# Patient Record
Sex: Female | Born: 1965 | Marital: Single | Smoking: Former smoker
Health system: Southern US, Community
[De-identification: ages and names within clinical notes are randomized; demographics above are authoritative.]

---

## 2012-10-06 ENCOUNTER — Other Ambulatory Visit: Payer: Self-pay | Admitting: Neurology

## 2012-10-06 DIAGNOSIS — M5412 Radiculopathy, cervical region: Secondary | ICD-10-CM

## 2012-10-06 DIAGNOSIS — R51 Headache: Secondary | ICD-10-CM

## 2016-12-29 ENCOUNTER — Ambulatory Visit: Payer: Self-pay | Admitting: Physician Assistant

## 2017-01-22 ENCOUNTER — Encounter: Payer: Self-pay | Admitting: Physician Assistant

## 2017-01-22 ENCOUNTER — Ambulatory Visit (INDEPENDENT_AMBULATORY_CARE_PROVIDER_SITE_OTHER): Payer: BLUE CROSS/BLUE SHIELD | Admitting: Physician Assistant

## 2017-01-22 VITALS — BP 134/77 | HR 91 | Ht 64.0 in | Wt 262.0 lb

## 2017-01-22 DIAGNOSIS — Z0000 Encounter for general adult medical examination without abnormal findings: Secondary | ICD-10-CM | POA: Diagnosis not present

## 2017-01-22 DIAGNOSIS — Z1239 Encounter for other screening for malignant neoplasm of breast: Secondary | ICD-10-CM

## 2017-01-22 DIAGNOSIS — Z1231 Encounter for screening mammogram for malignant neoplasm of breast: Secondary | ICD-10-CM

## 2017-01-22 DIAGNOSIS — L4050 Arthropathic psoriasis, unspecified: Secondary | ICD-10-CM

## 2017-01-22 DIAGNOSIS — Z13220 Encounter for screening for lipoid disorders: Secondary | ICD-10-CM | POA: Diagnosis not present

## 2017-01-22 DIAGNOSIS — Z131 Encounter for screening for diabetes mellitus: Secondary | ICD-10-CM

## 2017-01-22 NOTE — Progress Notes (Signed)
   Subjective:    Patient ID: Sharon Tyler, female    DOB: [birth year], 51 y.o.   MRN: [id number]  HPI  Pt is a 51 yo female with psoratic arthritis who presents to the clinic to establish care.   .. Active Ambulatory Problems    Diagnosis Date Noted  . Psoriatic arthritis (HCC) 01/22/2017  . Morbid obesity (HCC) 01/25/2017   Resolved Ambulatory Problems    Diagnosis Date Noted  . No Resolved Ambulatory Problems   No Additional Past Medical History   .Marland Kitchen. Family History  Problem Relation Age of Onset  . Cancer Mother     breast  . Cancer Father   . Hypertension Brother   . Cancer Maternal Aunt     colon   .Marland Kitchen. Social History   Social History  . Marital status: Single    Spouse name: N/A  . Number of children: N/A  . Years of education: N/A   Occupational History  . Not on file.   Social History Main Topics  . Smoking status: Former Games developermoker  . Smokeless tobacco: Never Used  . Alcohol use No  . Drug use: No  . Sexual activity: No   Other Topics Concern  . Not on file   Social History Narrative  . No narrative on file   DX of psoratic arthritis in 98 but start methotrexate 2 years ago. Taking 4 tablets weekly. Managed by rheumatology every 3 months.     Review of Systems  All other systems reviewed and are negative.      Objective:   Physical Exam  Constitutional: She is oriented to person, place, and time. She appears well-developed and well-nourished.  Morbidly obese.   HENT:  Head: Normocephalic and atraumatic.  Neck: Normal range of motion. Neck supple. No thyromegaly present.  Cardiovascular: Normal rate, regular rhythm and normal heart sounds.   Pulmonary/Chest: Effort normal and breath sounds normal.  Lymphadenopathy:    She has no cervical adenopathy.  Neurological: She is alert and oriented to person, place, and time.  Psychiatric: She has a normal mood and affect. Her behavior is normal.          Assessment & Plan:  Marland Kitchen.Marland Kitchen.Diagnoses and all  orders for this visit:  Routine physical examination -     Lipid panel -     COMPLETE METABOLIC PANEL WITH GFR -     TSH -     B12 -     Vitamin D 1,25 dihydroxy  Psoriatic arthritis (HCC)  Screening for lipid disorders -     Lipid panel  Screening for diabetes mellitus -     COMPLETE METABOLIC PANEL WITH GFR  Morbid obesity (HCC) -     TSH  Breast cancer screening -     MM DIGITAL SCREENING BILATERAL; Future -     MM DIGITAL SCREENING BILATERAL   Discussed cologuard. Pt agreed.  Discussed weight. Encouraged 150 minutes of cardio weekly. 1500 calorie diet. Come back to discussed medications for weight loss.

## 2017-01-22 NOTE — Patient Instructions (Signed)

## 2017-01-25 ENCOUNTER — Telehealth: Payer: Self-pay | Admitting: Physician Assistant

## 2017-01-25 NOTE — Telephone Encounter (Signed)
Pt did not fill out info for cologuard. Can we get her to come by and sign so kit can be sen in mail.

## 2017-01-26 NOTE — Telephone Encounter (Signed)
Left vmm for patient to come to office and sign form for Cologuard so kit can be sent to her.

## 2017-05-17 ENCOUNTER — Encounter: Payer: Self-pay | Admitting: Physician Assistant

## 2017-05-17 ENCOUNTER — Ambulatory Visit (INDEPENDENT_AMBULATORY_CARE_PROVIDER_SITE_OTHER): Payer: BLUE CROSS/BLUE SHIELD | Admitting: Physician Assistant

## 2017-05-17 VITALS — BP 135/82 | HR 111 | Ht 64.0 in | Wt 258.0 lb

## 2017-05-17 DIAGNOSIS — N3001 Acute cystitis with hematuria: Secondary | ICD-10-CM | POA: Diagnosis not present

## 2017-05-17 DIAGNOSIS — R195 Other fecal abnormalities: Secondary | ICD-10-CM | POA: Diagnosis not present

## 2017-05-17 DIAGNOSIS — R350 Frequency of micturition: Secondary | ICD-10-CM | POA: Diagnosis not present

## 2017-05-17 DIAGNOSIS — R109 Unspecified abdominal pain: Secondary | ICD-10-CM

## 2017-05-17 LAB — POCT URINALYSIS DIPSTICK
Bilirubin, UA: NEGATIVE
Glucose, UA: NEGATIVE
KETONES UA: NEGATIVE
Nitrite, UA: NEGATIVE
PROTEIN UA: NEGATIVE
SPEC GRAV UA: 1.01 (ref 1.010–1.025)
UROBILINOGEN UA: 0.2 U/dL
pH, UA: 6 (ref 5.0–8.0)

## 2017-05-17 MED ORDER — NITROFURANTOIN MONOHYD MACRO 100 MG PO CAPS
100.0000 mg | ORAL_CAPSULE | Freq: Two times a day (BID) | ORAL | 0 refills | Status: DC
Start: 2017-05-17 — End: 2019-01-04

## 2017-05-17 NOTE — Patient Instructions (Signed)

## 2017-05-17 NOTE — Progress Notes (Signed)
   Subjective:    Patient ID: Sharon Tyler, female    DOB: 1966-01-28, 51 y.o.   MRN: [id number]  HPI  Pt is a 51 yo morbidly obese female with psoratic arthritis who is on methotrexate who has left flank pain, urinary frequency/urgency for 5 days. No fever, chills. She has daily body aches, pain, myalgias. She stopped this weeks metotrexate and started taking AZO. She is drinking cranberry juice.   She is concerned with 2 green solid poops she had yesterday. She has not started any new daily medications or supplements. She has been on azo for 3 days and had some blue cake icing.   .. Active Ambulatory Problems    Diagnosis Date Noted  . Psoriatic arthritis (HCC) 01/22/2017  . Morbid obesity (HCC) 01/25/2017   Resolved Ambulatory Problems    Diagnosis Date Noted  . No Resolved Ambulatory Problems   No Additional Past Medical History         Review of Systems  All other systems reviewed and are negative.      Objective:   Physical Exam  Constitutional: She is oriented to person, place, and time. She appears well-developed and well-nourished.  Morbidly obese.   HENT:  Head: Normocephalic and atraumatic.  Cardiovascular: Normal rate, regular rhythm and normal heart sounds.   Pulmonary/Chest: Effort normal and breath sounds normal.  Mild tenderness left flank to palpation.   Abdominal:  Diffusely tender in bilateral lower abdominal quadrants.   Neurological: She is alert and oriented to person, place, and time.  Psychiatric: She has a normal mood and affect. Her behavior is normal.          Assessment & Plan:  Marland Kitchen.Marland Kitchen.Sharon Tyler was seen today for abdominal pain, left flank pain and urinary frequency.  Diagnoses and all orders for this visit:  Acute cystitis with hematuria -     nitrofurantoin, macrocrystal-monohydrate, (MACROBID) 100 MG capsule; Take 1 capsule (100 mg total) by mouth 2 (two) times daily. For 7 days. -     Urine Culture  Urinary frequency -     POCT  urinalysis dipstick -     Urine Culture  Left flank pain -     POCT urinalysis dipstick -     Urine Culture  Change in stool   .Marland Kitchen. Results for orders placed or performed in visit on 05/17/17  POCT urinalysis dipstick  Result Value Ref Range   Color, UA yellow    Clarity, UA cloudy    Glucose, UA neg    Bilirubin, UA neg    Ketones, UA neg    Spec Grav, UA 1.010 1.010 - 1.025   Blood, UA small    pH, UA 6.0 5.0 - 8.0   Protein, UA neg    Urobilinogen, UA 0.2 0.2 or 1.0 E.U./dL   Nitrite, UA neg    Leukocytes, UA Moderate (2+) (A) Negative   Will treat with abx.  Will culture.  Discussed symptomatic care.  If pain not improving may need to consider imaging to rule out kidney stone. Increase hydration.   May resume metotrexate this week. Follow up if symptoms not improving.   Unclear if anything pathological with 2 green poop. I feel like could be something she has eaten. She did have blue icing. Could also be AZO. Will continue to monitor.

## 2017-05-18 ENCOUNTER — Encounter: Payer: Self-pay | Admitting: Physician Assistant

## 2017-05-20 LAB — URINE CULTURE

## 2019-01-04 ENCOUNTER — Ambulatory Visit (INDEPENDENT_AMBULATORY_CARE_PROVIDER_SITE_OTHER): Payer: BLUE CROSS/BLUE SHIELD | Admitting: Physician Assistant

## 2019-01-04 ENCOUNTER — Encounter: Payer: Self-pay | Admitting: Physician Assistant

## 2019-01-04 VITALS — BP 118/78 | HR 93 | Temp 98.5°F | Ht 64.0 in | Wt 251.0 lb

## 2019-01-04 DIAGNOSIS — J40 Bronchitis, not specified as acute or chronic: Secondary | ICD-10-CM | POA: Diagnosis not present

## 2019-01-04 DIAGNOSIS — J329 Chronic sinusitis, unspecified: Secondary | ICD-10-CM

## 2019-01-04 MED ORDER — AZITHROMYCIN 250 MG PO TABS: ORAL_TABLET | ORAL | 0 refills | Status: DC

## 2019-01-04 MED ORDER — PREDNISONE 50 MG PO TABS: ORAL_TABLET | ORAL | 0 refills | Status: DC

## 2019-01-04 NOTE — Progress Notes (Signed)
   Subjective:    Patient ID: Sharon Tyler, female    DOB: August 20, 1966, 53 y.o.   MRN: [id number]  HPI  Pt is a 53 yo female with psoriatic arthritis and former smoker who presents to the clinic cough, sinus pressure, ear pressure, productive mucus for the last 2 weeks.  At one point she thought she is getting a little bit better and then took a turn for the worse.  She has been taking her methotrexate regularly.  She does admit today she felt a little bit dizzy.  She has not had anything to drink but coffee.  She has not really taken anything over-the-counter because of being on methotrexate. She denies any SOB, wheezing, body aches, chills.   .. Active Ambulatory Problems    Diagnosis Date Noted  . Psoriatic arthritis (HCC) 01/22/2017  . Morbid obesity (HCC) 01/25/2017   Resolved Ambulatory Problems    Diagnosis Date Noted  . No Resolved Ambulatory Problems   No Additional Past Medical History     Review of Systems    see HPI.  Objective:   Physical Exam Vitals signs reviewed.  Constitutional:      Appearance: Normal appearance. She is obese.  HENT:     Head: Normocephalic and atraumatic.     Right Ear: Tympanic membrane, ear canal and external ear normal.     Left Ear: Tympanic membrane, ear canal and external ear normal.     Nose: Congestion present.     Mouth/Throat:     Mouth: Mucous membranes are moist.     Pharynx: Posterior oropharyngeal erythema present. No oropharyngeal exudate.  Eyes:     Conjunctiva/sclera: Conjunctivae normal.  Cardiovascular:     Rate and Rhythm: Normal rate and regular rhythm.  Pulmonary:     Effort: Pulmonary effort is normal.     Breath sounds: Wheezing and rhonchi present.  Lymphadenopathy:     Cervical: Cervical adenopathy present.  Neurological:     General: No focal deficit present.     Mental Status: She is alert and oriented to person, place, and time.  Psychiatric:        Mood and Affect: Mood normal.        Behavior: Behavior  normal.           Assessment & Plan:  Marland KitchenMarland KitchenSadiqa was seen today for fever.  Diagnoses and all orders for this visit:  Sinobronchitis -     azithromycin (ZITHROMAX) 250 MG tablet; Take 2 tablets now and then one tablet for 4 days. -     predniSONE (DELTASONE) 50 MG tablet; Take one tablet for 5 days. -     DG Chest 2 View   Treated due to immunosuppressed state and duration with zpak and prednisone. Stay hydrated. BP was lower than normal today and could cause dizziness. Hold methotrexate until finish abx. Call rheumatology about increase if thinks was sick at the onset of labs.  Mentioned mammogram and colonoscopy. Pt declined.   Pt needs CPE in near future.

## 2019-06-30 ENCOUNTER — Ambulatory Visit (INDEPENDENT_AMBULATORY_CARE_PROVIDER_SITE_OTHER): Payer: BC Managed Care – PPO | Admitting: Physician Assistant

## 2019-06-30 ENCOUNTER — Ambulatory Visit: Payer: BLUE CROSS/BLUE SHIELD | Admitting: Physician Assistant

## 2019-06-30 ENCOUNTER — Other Ambulatory Visit: Payer: Self-pay

## 2019-06-30 ENCOUNTER — Encounter: Payer: Self-pay | Admitting: Physician Assistant

## 2019-06-30 VITALS — BP 132/67 | HR 91 | Temp 99.0°F | Ht 64.25 in | Wt 258.0 lb

## 2019-06-30 DIAGNOSIS — I8390 Asymptomatic varicose veins of unspecified lower extremity: Secondary | ICD-10-CM

## 2019-06-30 DIAGNOSIS — L4050 Arthropathic psoriasis, unspecified: Secondary | ICD-10-CM

## 2019-06-30 DIAGNOSIS — E569 Vitamin deficiency, unspecified: Secondary | ICD-10-CM | POA: Diagnosis not present

## 2019-06-30 DIAGNOSIS — Z0000 Encounter for general adult medical examination without abnormal findings: Secondary | ICD-10-CM

## 2019-06-30 DIAGNOSIS — Z13220 Encounter for screening for lipoid disorders: Secondary | ICD-10-CM

## 2019-06-30 DIAGNOSIS — Z1211 Encounter for screening for malignant neoplasm of colon: Secondary | ICD-10-CM

## 2019-06-30 DIAGNOSIS — E538 Deficiency of other specified B group vitamins: Secondary | ICD-10-CM

## 2019-06-30 NOTE — Progress Notes (Signed)
Subjective:     Sharon Tyler is a 53 y.o. female and is here for a comprehensive physical exam. The patient reports problems - see below. .  She is having a lot of trouble wearing mask all the time. She works at [employer]. She is not around people. She is willing to wear face shield and mask when cannot social distance.   She is desperate to lose weight. She wants to discuss plan.   Rheumatology gave her Enbrel because CRP was way up. She refuses to start due to cost and what's in it.     Social History   Socioeconomic History  . Marital status: Single    Spouse name: Not on file  . Number of children: Not on file  . Years of education: Not on file  . Highest education level: Not on file  Occupational History  . Not on file  Social Needs  . Financial resource strain: Not on file  . Food insecurity    Worry: Not on file    Inability: Not on file  . Transportation needs    Medical: Not on file    Non-medical: Not on file  Tobacco Use  . Smoking status: Former Research scientist (life sciences)  . Smokeless tobacco: Never Used  Substance and Sexual Activity  . Alcohol use: No  . Drug use: No  . Sexual activity: Never  Lifestyle  . Physical activity    Days per week: Not on file    Minutes per session: Not on file  . Stress: Not on file  Relationships  . Social Herbalist on phone: Not on file    Gets together: Not on file    Attends religious service: Not on file    Active member of club or organization: Not on file    Attends meetings of clubs or organizations: Not on file    Relationship status: Not on file  . Intimate partner violence    Fear of current or ex partner: Not on file    Emotionally abused: Not on file    Physically abused: Not on file    Forced sexual activity: Not on file  Other Topics Concern  . Not on file  Social History Narrative  . Not on file   Health Maintenance  Topic Date Due  . MAMMOGRAM  01/07/2020 (Originally 04/23/2014)  .  COLONOSCOPY  01/07/2020 (Originally 04/23/2014)  . Hepatitis C Screening  01/25/2027 (Originally 04/03/1966)  . HIV Screening  01/25/2027 (Originally 04/24/1979)  . INFLUENZA VACCINE  07/15/2019  . PAP SMEAR-Modifier  Discontinued  . TETANUS/TDAP  Discontinued    The following portions of the patient's history were reviewed and updated as appropriate: allergies, current medications, past family history, past medical history, past social history, past surgical history and problem list.  Review of Systems Pertinent items noted in HPI and remainder of comprehensive ROS otherwise negative.   Objective:    BP 132/67   Pulse 91   Temp 99 F (37.2 C) (Oral)   Ht 5' 4.25" (1.632 m)   Wt 258 lb (117 kg)   SpO2 96%   BMI 43.94 kg/m  General appearance: alert, cooperative, appears stated age and morbidly obese Head: Normocephalic, without obvious abnormality, atraumatic Eyes: conjunctivae/corneas clear. PERRL, EOM's intact. Fundi benign. Ears: normal TM's and external ear canals both ears Nose: Nares normal. Septum midline. Mucosa normal. No drainage or sinus tenderness. Throat: lips, mucosa, and tongue normal; teeth and gums normal Neck:  no adenopathy, no carotid bruit, no JVD, supple, symmetrical, trachea midline and thyroid not enlarged, symmetric, no tenderness/mass/nodules Back: symmetric, no curvature. ROM normal. No CVA tenderness. Lungs: clear to auscultation bilaterally Heart: regular rate and rhythm, S1, S2 normal, no murmur, click, rub or gallop Abdomen: soft, non-tender; bowel sounds normal; no masses,  no organomegaly Extremities: extremities normal, atraumatic, no cyanosis or edema and herberdens nodules noted.  Pulses: 2+ and symmetric Skin: Skin color, texture, turgor normal. No rashes or lesions Lymph nodes: Cervical, supraclavicular, and axillary nodes normal. Neurologic: Alert and oriented X 3, normal strength and tone. Normal symmetric reflexes. Normal coordination and  gait    Assessment:    Healthy female exam.      Plan:    Marland Kitchen.Marland Kitchen.Tresa EndoKelly was seen today for annual exam.  Diagnoses and all orders for this visit:  Routine physical examination -     VITAMIN D 25 Hydroxy (Vit-D Deficiency, Fractures) -     Cancel: B12 and Folate Panel -     Lipid Panel w/reflex Direct LDL -     Cancel: TSH -     COMPLETE METABOLIC PANEL WITH GFR  Psoriatic arthritis (HCC)  Varicose veins of lower extremity, unspecified laterality, unspecified whether complicated  Vitamin deficiency -     VITAMIN D 25 Hydroxy (Vit-D Deficiency, Fractures)  B12 deficiency -     Cancel: B12 and Folate Panel  Screening for lipid disorders -     Lipid Panel w/reflex Direct LDL  Morbid obesity (HCC) -     Cancel: TSH   .Marland Kitchen. [facility] screen [facility]/30/2019  Decreased Interest 0  Down, Depressed, Hopeless 0  PHQ - 2 Score 0  Altered sleeping 2  Tired, decreased energy 2  Change in appetite 0  Feeling bad or failure about yourself  0  Trouble concentrating 0  Moving slowly or fidgety/restless 0  Suicidal thoughts 0  PHQ-9 Score 4  Difficult doing work/chores Somewhat difficult   .Marland Kitchen. Discussed 150 minutes of exercise a week.  Encouraged vitamin D 1000 units and Calcium 1300mg  or 4 servings of dairy a day.  Fasting labs ordered.  Declined pap.  Declined mammogram/colonoscopy. Pt aware of risk.  She agreed to do cologuard.  Declined shingrix.   Will write mask for intermittent use.   Marland Kitchen..Discussed low carb diet with 1500 calories and 80g of protein.  Exercising at least 150 minutes a week.  My Fitness Pal could be a [employer] resource.  Discussed IF and benefits.  Discussed saxenda/contrave/qsymia. If she would like to try see what insurance would pay for.    See After Visit Summary for Counseling Recommendations

## 2019-06-30 NOTE — Patient Instructions (Signed)
Health Maintenance, Female Adopting a healthy lifestyle and getting preventive care are important in promoting health and wellness. Ask your health care provider about:  The right schedule for you to have regular tests and exams.  Things you can do on your own to prevent diseases and keep yourself healthy. What should I know about diet, weight, and exercise? Eat a healthy diet   Eat a diet that includes plenty of vegetables, fruits, low-fat dairy products, and lean protein.  Do not eat a lot of foods that are high in solid fats, added sugars, or sodium. Maintain a healthy weight Body mass index (BMI) is used to identify weight problems. It estimates body fat based on height and weight. Your health care provider can help determine your BMI and help you achieve or maintain a healthy weight. Get regular exercise Get regular exercise. This is one of the most important things you can do for your health. Most adults should:  Exercise for at least 150 minutes each week. The exercise should increase your heart rate and make you sweat (moderate-intensity exercise).  Do strengthening exercises at least twice a week. This is in addition to the moderate-intensity exercise.  Spend less time sitting. Even light physical activity can be beneficial. Watch cholesterol and blood lipids Have your blood tested for lipids and cholesterol at 55 years of age, then have this test every 5 years. Have your cholesterol levels checked more often if:  Your lipid or cholesterol levels are high.  You are older than 55 years of age.  You are at high risk for heart disease. What should I know about cancer screening? Depending on your health history and family history, you may need to have cancer screening at various ages. This may include screening for:  Breast cancer.  Cervical cancer.  Colorectal cancer.  Skin cancer.  Lung cancer. What should I know about heart disease, diabetes, and high blood  pressure? Blood pressure and heart disease  High blood pressure causes heart disease and increases the risk of stroke. This is more likely to develop in people who have high blood pressure readings, are of African descent, or are overweight.  Have your blood pressure checked: ? Every 3-5 years if you are 18-39 years of age. ? Every year if you are 40 years old or older. Diabetes Have regular diabetes screenings. This checks your fasting blood sugar level. Have the screening done:  Once every three years after age 40 if you are at a normal weight and have a low risk for diabetes.  More often and at a younger age if you are overweight or have a high risk for diabetes. What should I know about preventing infection? Hepatitis B If you have a higher risk for hepatitis B, you should be screened for this virus. Talk with your health care provider to find out if you are at risk for hepatitis B infection. Hepatitis C Testing is recommended for:  Everyone born from 1945 through 1965.  Anyone with known risk factors for hepatitis C. Sexually transmitted infections (STIs)  Get screened for STIs, including gonorrhea and chlamydia, if: ? You are sexually active and are younger than 55 years of age. ? You are older than 55 years of age and your health care provider tells you that you are at risk for this type of infection. ? Your sexual activity has changed since you were last screened, and you are at increased risk for chlamydia or gonorrhea. Ask your health care provider if   you are at risk.  Ask your health care provider about whether you are at high risk for HIV. Your health care provider may recommend a prescription medicine to help prevent HIV infection. If you choose to take medicine to prevent HIV, you should first get tested for HIV. You should then be tested every 3 months for as long as you are taking the medicine. Pregnancy  If you are about to stop having your period (premenopausal) and  you may become pregnant, seek counseling before you get pregnant.  Take 400 to 800 micrograms (mcg) of folic acid every day if you become pregnant.  Ask for birth control (contraception) if you want to prevent pregnancy. Osteoporosis and menopause Osteoporosis is a disease in which the bones lose minerals and strength with aging. This can result in bone fractures. If you are 65 years old or older, or if you are at risk for osteoporosis and fractures, ask your health care provider if you should:  Be screened for bone loss.  Take a calcium or vitamin D supplement to lower your risk of fractures.  Be given hormone replacement therapy (HRT) to treat symptoms of menopause. Follow these instructions at home: Lifestyle  Do not use any products that contain nicotine or tobacco, such as cigarettes, e-cigarettes, and chewing tobacco. If you need help quitting, ask your health care provider.  Do not use street drugs.  Do not share needles.  Ask your health care provider for help if you need support or information about quitting drugs. Alcohol use  Do not drink alcohol if: ? Your health care provider tells you not to drink. ? You are pregnant, may be pregnant, or are planning to become pregnant.  If you drink alcohol: ? Limit how much you use to 0-1 drink a day. ? Limit intake if you are breastfeeding.  Be aware of how much alcohol is in your drink. In the [place] one drink equals one 12 oz bottle of beer (355 mL), one 5 oz glass of wine (148 mL), or one 1 oz glass of hard liquor (44 mL). General instructions  Schedule regular health, dental, and eye exams.  Stay current with your vaccines.  Tell your health care provider if: ? You often feel depressed. ? You have ever been abused or do not feel safe at home. Summary  Adopting a healthy lifestyle and getting preventive care are important in promoting health and wellness.  Follow your health care provider's instructions about healthy  diet, exercising, and getting tested or screened for diseases.  Follow your health care provider's instructions on monitoring your cholesterol and blood pressure. This information is not intended to replace advice given to you by your health care provider. Make sure you discuss any questions you have with your health care provider. Document Released: 06/15/2011 Document Revised: 11/23/2018 Document Reviewed: 11/23/2018 Elsevier Patient Education  2020 [employer]  

## 2019-07-03 DIAGNOSIS — E559 Vitamin D deficiency, unspecified: Secondary | ICD-10-CM | POA: Insufficient documentation

## 2019-07-03 DIAGNOSIS — E569 Vitamin deficiency, unspecified: Secondary | ICD-10-CM | POA: Insufficient documentation

## 2019-07-03 DIAGNOSIS — E538 Deficiency of other specified B group vitamins: Secondary | ICD-10-CM | POA: Insufficient documentation

## 2019-11-20 ENCOUNTER — Ambulatory Visit (INDEPENDENT_AMBULATORY_CARE_PROVIDER_SITE_OTHER): Payer: BC Managed Care – PPO

## 2019-11-20 ENCOUNTER — Encounter: Payer: Self-pay | Admitting: Physician Assistant

## 2019-11-20 ENCOUNTER — Ambulatory Visit (INDEPENDENT_AMBULATORY_CARE_PROVIDER_SITE_OTHER): Payer: BC Managed Care – PPO | Admitting: Physician Assistant

## 2019-11-20 ENCOUNTER — Other Ambulatory Visit: Payer: Self-pay

## 2019-11-20 VITALS — BP 147/74 | HR 83 | Ht 64.0 in | Wt 265.0 lb

## 2019-11-20 DIAGNOSIS — M5441 Lumbago with sciatica, right side: Secondary | ICD-10-CM | POA: Diagnosis not present

## 2019-11-20 DIAGNOSIS — M4807 Spinal stenosis, lumbosacral region: Secondary | ICD-10-CM | POA: Diagnosis not present

## 2019-11-20 DIAGNOSIS — M5136 Other intervertebral disc degeneration, lumbar region: Secondary | ICD-10-CM

## 2019-11-20 DIAGNOSIS — M62838 Other muscle spasm: Secondary | ICD-10-CM

## 2019-11-20 DIAGNOSIS — G8929 Other chronic pain: Secondary | ICD-10-CM | POA: Insufficient documentation

## 2019-11-20 DIAGNOSIS — M47816 Spondylosis without myelopathy or radiculopathy, lumbar region: Secondary | ICD-10-CM | POA: Diagnosis not present

## 2019-11-20 DIAGNOSIS — R29898 Other symptoms and signs involving the musculoskeletal system: Secondary | ICD-10-CM

## 2019-11-20 MED ORDER — PREDNISONE 50 MG PO TABS: ORAL_TABLET | ORAL | 0 refills | Status: DC

## 2019-11-20 MED ORDER — KETOROLAC TROMETHAMINE 60 MG/2ML IM SOLN
60.0000 mg | Freq: Once | INTRAMUSCULAR | Status: AC
  Administered 2019-11-20: 60 mg via INTRAMUSCULAR

## 2019-11-20 MED ORDER — CYCLOBENZAPRINE HCL 10 MG PO TABS: 10.0000 mg | ORAL_TABLET | Freq: Three times a day (TID) | ORAL | 0 refills | Status: DC | PRN

## 2019-11-20 MED ORDER — IBUPROFEN 800 MG PO TABS: 800.0000 mg | ORAL_TABLET | Freq: Three times a day (TID) | ORAL | 0 refills | Status: AC | PRN

## 2019-11-20 NOTE — Progress Notes (Signed)
Subjective:     Patient ID: Sharon Tyler, female   DOB: 11/04/1966, 53 y.o.   MRN: [id number]  HPI Pt is a 53 yo female with a hx psoriatic arthritis and  Pt reported spinal stenosis who presents to the clinic with complaints of tight back pain.   She reports the pain beginning in May and progressively worsening since then. Pt describes the pain as severe and sharp. Pain is worse on the right side.    She admits to numbness and tingling in bilateral lower extremities. Positive for multiple episodes of urinary incontinence during bouts of extreme pain. Denies bowel incontinence or saddle anesthesia. She does feel like her right leg is getting weaker. At times she feels like "it just will not lift".    Denies recent injury. 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 Pt states working makes the pain worse.  Ibuprofen, stretching exercises, heat/ice provide minimal relief. Tried TENS machine, with some relief.    She states it is difficult for her to move at times.  She reports that when sttanding on feet for long periods of time she starts to get shaky. These episodes are only relieved by rest. She does get some relief when leaning forward.   Last xray was done in 2013.i cannot see image.   .. Active Ambulatory Problems    Diagnosis Date Noted  . Psoriatic arthritis ([place]) 01/22/2017  . Morbid obesity ([place]) 01/25/2017  . Varicose veins of lower extremity 06/30/2019  . B12 deficiency 07/03/2019  . Vitamin deficiency 07/03/2019  . Chronic right-sided low back pain with right-sided sciatica 11/20/2019  . Spinal stenosis of lumbosacral region 11/20/2019  . Facet arthritis of lumbar region 11/21/2019  . DDD (degenerative disc disease), lumbar 11/21/2019  . Muscle spasm 11/21/2019  . Right leg weakness 11/21/2019   Resolved Ambulatory Problems    Diagnosis Date Noted  . No Resolved Ambulatory Problems   No Additional Past Medical History     Review  of Systems See HPI.     Objective:   .Physical Exam Constitutional:Pt is obese.  HENT:     Head: Normocephalic and atraumatic.  Eyes:     Extraocular Movements: EOM normal.     Conjunctiva/sclera: Conjunctivae normal.  Neck:     Musculoskeletal: Normal range of motion and neck supple.  Cardiovascular:     Rate and Rhythm: Normal rate and regular rhythm.     Heart sounds: Normal heart sounds.  Pulmonary:     Effort: Pulmonary effort is normal.     Breath sounds: Normal breath sounds.  Musculoskeletal:     Severe point tenderness over right mid back. Mild TTP over spinal processes. Relief of pain when leaning forward.  Decreased strength in the right lower extremity 3/5. Left lower extremity 5/5.  Patellar reflexes 1+, bilaterally. Straight leg test bilaterally brought axial back pain but no definite radicular pain. Decreased ROM with any movement of the waist.  Skin:    General: Skin is warm and dry.  Neurological:     Mental Status: She is alert and oriented to person, place, and time. Reflexes in tact. Positive SLR on right side, negative SLR on left  Psychiatric:        Mood and Affect: Affect normal.  .   Assessment:    Diagnoses and all orders for this visit:  Chronic right-sided low back pain with right-sided sciatica -     DG Lumbar Spine Complete -  cyclobenzaprine (FLEXERIL) 10 MG tablet; Take 1 tablet (10 mg total) by mouth 3 (three) times daily as needed for muscle spasms. -     predniSONE (DELTASONE) 50 MG tablet; One tab PO daily for 5 days. -     ibuprofen (ADVIL) 800 MG tablet; Take 1 tablet (800 mg total) by mouth every 8 (eight) hours as needed. -     ketorolac (TORADOL) injection 60 mg -     Ambulatory referral to Physical Therapy  Muscle spasm -     DG Lumbar Spine Complete -     cyclobenzaprine (FLEXERIL) 10 MG tablet; Take 1 tablet (10 mg total) by mouth 3 (three) times daily as needed for muscle spasms. -     predniSONE (DELTASONE) 50 MG tablet; One  tab PO daily for 5 days. -     ibuprofen (ADVIL) 800 MG tablet; Take 1 tablet (800 mg total) by mouth every 8 (eight) hours as needed. -     ketorolac (TORADOL) injection 60 mg -     Ambulatory referral to Physical Therapy  DDD (degenerative disc disease), lumbar -     DG Lumbar Spine Complete -     cyclobenzaprine (FLEXERIL) 10 MG tablet; Take 1 tablet (10 mg total) by mouth 3 (three) times daily as needed for muscle spasms. -     predniSONE (DELTASONE) 50 MG tablet; One tab PO daily for 5 days. -     ibuprofen (ADVIL) 800 MG tablet; Take 1 tablet (800 mg total) by mouth every 8 (eight) hours as needed. -     ketorolac (TORADOL) injection 60 mg -     Ambulatory referral to Physical Therapy  Facet arthritis of lumbar region -     DG Lumbar Spine Complete -     cyclobenzaprine (FLEXERIL) 10 MG tablet; Take 1 tablet (10 mg total) by mouth 3 (three) times daily as needed for muscle spasms. -     predniSONE (DELTASONE) 50 MG tablet; One tab PO daily for 5 days. -     ibuprofen (ADVIL) 800 MG tablet; Take 1 tablet (800 mg total) by mouth every 8 (eight) hours as needed. -     ketorolac (TORADOL) injection 60 mg -     Ambulatory referral to Physical Therapy  Right leg weakness -     Ambulatory referral to Physical Therapy  Other intervertebral disc degeneration, lumbar region -     MR Lumbar Spine Wo Contrast      Plan:     Acute muscle spasm of the right mid back likely due condition of the spine. Start Flexeril. Discussed sedation warning.   Pt has not had imaging of back since 2013. X-ray and MRI should be performed to assess the spine. Due to red flag sx of urinary incontinence and right leg weakness, MRI should be completed to assess for possible spinal cord/ nerve root compression.   Toradol shot given for right sided- low back pain and muscle spasm. Start prednisone burst tomorrow for inflammation. Start 800mg  ibuprofen as needed for pain.   Continue TENS machine, heat and  stretching as tolerated. Formal PT ordered.   Pt written out of work due to bending, twisting and lifting nature of her job. Follow up with sports medicine in 2 weeks or sooner if no improvement to make a plan to get back to work or plan for injections etc.     . PA-C, have reviewed and agree with the above documentation in it's entirety.   Harlon FlorMarland Kitchen  Spent 30 minutes with patient and greater than 50 percent of visit spent counseling patient regarding treatment plan.

## 2019-11-20 NOTE — Patient Instructions (Signed)

## 2019-11-21 ENCOUNTER — Encounter: Payer: Self-pay | Admitting: Neurology

## 2019-11-21 DIAGNOSIS — M62838 Other muscle spasm: Secondary | ICD-10-CM | POA: Insufficient documentation

## 2019-11-21 DIAGNOSIS — M47816 Spondylosis without myelopathy or radiculopathy, lumbar region: Secondary | ICD-10-CM | POA: Insufficient documentation

## 2019-11-21 DIAGNOSIS — M5136 Other intervertebral disc degeneration, lumbar region: Secondary | ICD-10-CM | POA: Insufficient documentation

## 2019-11-21 DIAGNOSIS — R29898 Other symptoms and signs involving the musculoskeletal system: Secondary | ICD-10-CM | POA: Insufficient documentation

## 2019-11-21 NOTE — Progress Notes (Signed)
Call pt: degenerative changes of the lower spine and arthritis. No seen slippage of disc.I think a lot of your pain still could be muscular however it is concerning with your weakness in the right leg. I still think we should get MRI.

## 2019-11-21 NOTE — Telephone Encounter (Signed)
This encounter was created in error - please disregard.

## 2019-12-04 ENCOUNTER — Ambulatory Visit: Payer: BC Managed Care – PPO | Admitting: Physician Assistant

## 2019-12-04 ENCOUNTER — Telehealth: Payer: Self-pay | Admitting: Neurology

## 2019-12-04 NOTE — Telephone Encounter (Signed)
Spoke with patient. Appt moved from today for follow up back pain. She had received results of Xray on mychart but did not understand them. Went over results and need for MRI. She is having MRI and PT at the beginning of the year due to high deductible. Appt made for next week for follow up to assess back pain. She is bringing by disability forms for completion. Will keep out of work til her follow up as her last note states at least two weeks out of work but will determine return to work date after follow up appt. Patient still having pain. If can not see Allizon Woznick next week she will make a follow up with Dr. Su Hoff - FYI.

## 2019-12-05 NOTE — Telephone Encounter (Signed)
She is currently scheduled.

## 2019-12-05 NOTE — Telephone Encounter (Signed)
I should be able to see her next week.

## 2019-12-13 ENCOUNTER — Other Ambulatory Visit: Payer: Self-pay

## 2019-12-13 ENCOUNTER — Ambulatory Visit (INDEPENDENT_AMBULATORY_CARE_PROVIDER_SITE_OTHER): Payer: BC Managed Care – PPO | Admitting: Physician Assistant

## 2019-12-13 ENCOUNTER — Encounter: Payer: Self-pay | Admitting: Physician Assistant

## 2019-12-13 VITALS — BP 141/68 | HR 98 | Ht 64.0 in | Wt 270.0 lb

## 2019-12-13 DIAGNOSIS — M62838 Other muscle spasm: Secondary | ICD-10-CM

## 2019-12-13 DIAGNOSIS — L4050 Arthropathic psoriasis, unspecified: Secondary | ICD-10-CM

## 2019-12-13 DIAGNOSIS — R29898 Other symptoms and signs involving the musculoskeletal system: Secondary | ICD-10-CM

## 2019-12-13 DIAGNOSIS — M47816 Spondylosis without myelopathy or radiculopathy, lumbar region: Secondary | ICD-10-CM

## 2019-12-13 DIAGNOSIS — G8929 Other chronic pain: Secondary | ICD-10-CM

## 2019-12-13 DIAGNOSIS — M5136 Other intervertebral disc degeneration, lumbar region: Secondary | ICD-10-CM

## 2019-12-13 DIAGNOSIS — M5441 Lumbago with sciatica, right side: Secondary | ICD-10-CM

## 2019-12-13 NOTE — Progress Notes (Signed)
Subjective:    Patient ID: Sharon Tyler, female    DOB: 1966-07-24, 53 y.o.   MRN: [id number]  HPI  Pt is a 53 yo female with chronic low back pain to the right with Lumbar DDD, facet arthritis, psoriatic arthritis and right leg weakness who presents for follow up. Xray showed Vertebral body alignment, heights and disc space heights are normal. There is mild spondylosis throughout the lumbar spine to include mild facet arthropathy over the lower lumbar spine. There is no spondylolisthesis or spondylolysis. No significant compression Fracture.  Ibuprofen, flexeril helps but still a lot of pain. She does feel like tens unit is helping some as well. She is also having some radiation down the back and side of right leg into right knee. She also has some numbness in anterior thigh more L3/L4 dermatome. No bowel or bladder dysfunction. She has not started PT yet. She has MRI scheduled for beginning of the year. She has significant pain bending forward, lifting, pulling, pushing. She admits to back pain with sneezing and coughing. She remains out of work.                                                                                                     .. Active Ambulatory Problems    Diagnosis Date Noted  . Psoriatic arthritis ([place]) 01/22/2017  . Morbid obesity ([place]) 01/25/2017  . Varicose veins of lower extremity 06/30/2019  . B12 deficiency 07/03/2019  . Vitamin deficiency 07/03/2019  . Chronic right-sided low back pain with right-sided sciatica 11/20/2019  . Spinal stenosis of lumbosacral region 11/20/2019  . Facet arthritis of lumbar region 11/21/2019  . DDD (degenerative disc disease), lumbar 11/21/2019  . Muscle spasm 11/21/2019  . Right leg weakness 11/21/2019  . Other intervertebral disc degeneration, lumbar region 11/21/2019   Resolved Ambulatory Problems    Diagnosis Date Noted  . No Resolved Ambulatory Problems   No Additional Past Medical History     Review of  Systems See HPI.     Objective:   Physical Exam Vitals reviewed.  Constitutional:      Appearance: Normal appearance. She is obese.  HENT:     Head: Normocephalic.  Cardiovascular:     Rate and Rhythm: Normal rate.     Pulses: Normal pulses.  Pulmonary:     Effort: Pulmonary effort is normal.  Musculoskeletal:     Comments: Limited ROM at waist and side to side due to pain.  No tenderness over lumbar spine to palpation.  Tight paraspinal lumbar muscles.  Right leg slightly weaker than left 4/5.  DTR 1+ but symmetric, bilaterally.    Neurological:     General: No focal deficit present.     Mental Status: She is alert and oriented to person, place, and time.  Psychiatric:        Mood and Affect: Mood normal.           Assessment & Plan:  Marland KitchenMarland KitchenJae was seen today for back pain.  Diagnoses and all orders for this visit:  DDD (degenerative disc disease), lumbar  Right leg weakness  Muscle spasm  Facet arthritis of lumbar region  Psoriatic arthritis ([facility])  Chronic right-sided low back pain with right-sided sciatica  pain certainly sounds disogenic with some radiculopathy to the right.  Start PT.  Get MRI.  FMLA filled out to continue to be out of work for the next month.  Continue ibuprofen/flexeril/tens unit/icy hot/stretches.  Follow up with Dr. Karie Schwalbe after MRI.

## 2019-12-14 ENCOUNTER — Telehealth: Payer: Self-pay | Admitting: Neurology

## 2019-12-14 NOTE — Telephone Encounter (Signed)
[employer] forms completed, signed, faxed to company at [phone] and [phone] with confirmation received. Copy to scan. Copy at front for patient pick up per her request.

## 2019-12-24 ENCOUNTER — Other Ambulatory Visit: Payer: Self-pay

## 2019-12-24 ENCOUNTER — Ambulatory Visit (INDEPENDENT_AMBULATORY_CARE_PROVIDER_SITE_OTHER): Payer: BC Managed Care – PPO

## 2019-12-24 DIAGNOSIS — M5136 Other intervertebral disc degeneration, lumbar region: Secondary | ICD-10-CM

## 2019-12-25 NOTE — Progress Notes (Signed)
You do have some facet degeneration(arthritis) and some small disc protrusions in the lumbar spine. I want you to see Dr. Karie Schwalbe. I think next step would be continuing PT, meds and considering injections.

## 2019-12-28 ENCOUNTER — Ambulatory Visit (INDEPENDENT_AMBULATORY_CARE_PROVIDER_SITE_OTHER): Payer: BC Managed Care – PPO | Admitting: Sports Medicine

## 2019-12-28 ENCOUNTER — Encounter: Payer: Self-pay | Admitting: Sports Medicine

## 2019-12-28 ENCOUNTER — Other Ambulatory Visit: Payer: Self-pay

## 2019-12-28 DIAGNOSIS — G8929 Other chronic pain: Secondary | ICD-10-CM | POA: Diagnosis not present

## 2019-12-28 DIAGNOSIS — M5441 Lumbago with sciatica, right side: Secondary | ICD-10-CM | POA: Diagnosis not present

## 2019-12-28 NOTE — Assessment & Plan Note (Signed)
Sharon Tyler is a pleasant 54 year old female with chronic low back pain, she localizes it at her right sacroiliac joint. It does radiate down the leg but not to the foot, she did have an MRI that showed severe left L5-S1 facet arthritis, she really does not have any left-sided symptoms. She also has no areas of neuroforaminal stenosis to explain the pain going down her leg. I think the majority of her low back pain is related to her right sacroiliac joint, we are going to start with aggressive physical therapy (including prescription strength treatment with iontophoresis and phonophoresis), and if this fails after 6 weeks we will consider a right sacroiliac joint injection.  She does have right periscapular pain likely radiating from her neck, with right-sided upper extremity radiculitis but she feels as though she can live with it so we will hold off on treating this for now.

## 2019-12-28 NOTE — Progress Notes (Signed)
    Procedures performed today:    None.  Independent interpretation of tests performed by another provider:   Personally reviewed her lumbar spine MRI, her discs look pretty darn good, she does have fairly severe left L5-S1 facet arthritis.  Impression and Recommendations:    Chronic right-sided low back pain with right-sided sciatica Sharon Tyler is a pleasant 54 year old female with chronic low back pain, she localizes it at her right sacroiliac joint. It does radiate down the leg but not to the foot, she did have an MRI that showed severe left L5-S1 facet arthritis, she really does not have any left-sided symptoms. She also has no areas of neuroforaminal stenosis to explain the pain going down her leg. I think the majority of her low back pain is related to her right sacroiliac joint, we are going to start with aggressive physical therapy (including prescription strength treatment with iontophoresis and phonophoresis), and if this fails after 6 weeks we will consider a right sacroiliac joint injection.  She does have right periscapular pain likely radiating from her neck, with right-sided upper extremity radiculitis but she feels as though she can live with it so we will hold off on treating this for now.  Morbid obesity ([facility]) Currently unfortunately has another problem, her weight, this is absolutely contributing to her SI joint pathology. I would recommend aggressive treatment of her weight with her PCP including bariatric surgical procedures.    ___________________________________________ Sharon Tyler. Sharon Tyler, M.D., ABFM., CAQSM. Primary Care and Sports Medicine [facility]  Adjunct Instructor of Family Medicine  [employer]

## 2019-12-28 NOTE — Assessment & Plan Note (Signed)
Currently unfortunately has another problem, her weight, this is absolutely contributing to her SI joint pathology. I would recommend aggressive treatment of her weight with her PCP including bariatric surgical procedures.

## 2020-01-03 ENCOUNTER — Ambulatory Visit (INDEPENDENT_AMBULATORY_CARE_PROVIDER_SITE_OTHER): Payer: BC Managed Care – PPO | Admitting: Rehabilitative and Restorative Service Providers"

## 2020-01-03 ENCOUNTER — Other Ambulatory Visit: Payer: Self-pay

## 2020-01-03 DIAGNOSIS — R29898 Other symptoms and signs involving the musculoskeletal system: Secondary | ICD-10-CM | POA: Diagnosis not present

## 2020-01-03 DIAGNOSIS — M545 Low back pain, unspecified: Secondary | ICD-10-CM

## 2020-01-03 DIAGNOSIS — R293 Abnormal posture: Secondary | ICD-10-CM | POA: Diagnosis not present

## 2020-01-03 DIAGNOSIS — M6281 Muscle weakness (generalized): Secondary | ICD-10-CM

## 2020-01-03 NOTE — Patient Instructions (Addendum)
Access Code: [email or web address]  URL: [email or web address]  Date: 01/03/2020  Prepared by: Margretta Ditty   Exercises Supine Lower Trunk Rotation - 5 reps - 1 sets - 15 seconds hold - 2x daily - 7x weekly Supine Piriformis Stretch with Foot on Ground - 3 reps - 1 sets - 15 seconds hold - 2x daily - 7x weekly Supine March - 10 reps - 1 sets - 2x daily - 7x weekly Seated Hamstring Stretch with Chair - 3 reps - 1 sets - 30 seconds hold - 2x daily - 7x weekly

## 2020-01-03 NOTE — Therapy (Signed)
[facility] [address] [place], [place], [address] Phone: [phone]   Fax:  [phone]  Physical Therapy Evaluation  Patient Details  Name: Sharon Tyler MRN: [id number] Date of Birth: 01/03/1966 Referring Provider (PT): Monica Becton, MD   Encounter Date: 01/03/2020  PT End of Session - 01/03/20 1040    Visit Number  1    Number of Visits  12    Date for PT Re-Evaluation  02/17/20    Authorization Type  bcbs    PT Start Time  0933    PT Stop Time  1030    PT Time Calculation (min)  57 min    Activity Tolerance  Patient tolerated treatment well;No increased pain    Behavior During Therapy  [facility] for tasks assessed/performed       No past medical history on file.  No past surgical history on file.  There were no vitals filed for this visit.   Subjective Assessment - 01/03/20 0936    Subjective  The patient reports ongoing low back problems worsened when bending forward at work in October 2020.  She was squatting and turned and her back locked up.  She reports sensory changes on the right side of the trunk noting burning herself with a hot pack and not knowing it until a doctor noted skin changes.    Pertinent History  psoriatic arthritis (dx since 1998),    Diagnostic tests  IMPRESSION:1. Severe left facet arthrosis at L5-S1.2. Small left foraminal disc protrusions at L2-3 and L3-4 withoutstenosis.    Patient Stated Goals  No pain.    Currently in Pain?  Yes    Pain Score  --   Not rated; describes a "nagging" pain   Pain Location  Back    Pain Orientation  Right;Lower;Mid   pain is central lumbar spine, R mid back (beneath scapula); and radiates into R hip with standing.   Pain Descriptors / Indicators  Aching   gets more sharp and numb feeling as she stands   Pain Type  Chronic pain    Pain Onset  More than a month ago    Pain Frequency  Constant    Aggravating Factors   varies in intensity; worse  with standing    Pain Relieving Factors  reest         [facility] PT Assessment - 01/03/20 0950      Assessment   Medical Diagnosis  M54.41,G89.29 (ICD-10-CM) - Chronic right-sided low back pain with right-sided sciatica    Referring Provider (PT)  Monica Becton, MD    Onset Date/Surgical Date  --   11/2019   Hand Dominance  Right    Prior Therapy  Has had PT for the shoulder in 2012      Precautions   Precaution Comments  has psoriatic arthritis      Restrictions   Weight Bearing Restrictions  No      Balance Screen   Has the patient fallen in the past 6 months  No    Has the patient had a decrease in activity level because of a fear of falling?   No    Is the patient reluctant to leave their home because of a fear of falling?   No      Home Nurse, mental health  Private residence    Living Arrangements  Children    Home Access  Stairs to enter    Entrance [place] of  Steps  6    Home Layout  Two level    Home Equipment  None    Additional Comments  She notes not feeling stable descending steps due to R leg weakness      Prior Function   Level of Independence  Independent    Vocation  On disability   since November 08, 2019   Vocation Requirements  lifting requirements, squat, unload trucks      Observation/Other Assessments   Focus on Therapeutic Outcomes (FOTO)   35% (65% limited)      Sensation   Light Touch  --   some numbness with prolonged standing     Posture/Postural Control   Posture/Postural Control  Postural limitations    Postural Limitations  Rounded Shoulders;Forward head    Posture Comments  joint changes at the hands from arthritis      ROM / Strength   AROM / PROM / Strength  AROM;Strength      AROM   AROM Assessment Site  Lumbar    Lumbar Flexion  50% limited    Lumbar Extension  --   feels good; relieves pain   Lumbar - Right Side Bend  75% limited    Lumbar - Left Side Bend  75% limited    Lumbar - Right  Rotation  50% limited    Lumbar - Left Rotation  50% limited      Strength   Overall Strength  Deficits    Strength Assessment Site  Hip;Knee;Ankle    Right/Left Hip  Right;Left    Right Hip Flexion  4/5    Left Hip Flexion  4/5    Right/Left Knee  Right;Left    Right Knee Flexion  5/5    Right Knee Extension  4-/5    Left Knee Flexion  5/5    Left Knee Extension  5/5    Right/Left Ankle  Right;Left    Right Ankle Dorsiflexion  5/5    Left Ankle Dorsiflexion  5/5      Flexibility   Soft Tissue Assessment /Muscle Length  yes    Piriformis  tightness in bilat hip flexors, hamstrings and piriformis      Palpation   SI assessment   pain along R sacral border into R PSIS and R gluteal region    Palpation comment  tenderness mid thoracic spine with dec'd mobility noted                Objective measurements completed on examination: See above findings.      [facility] Adult PT Treatment/Exercise - 01/03/20 0950      Exercises   Exercises  Lumbar      Lumbar Exercises: Stretches   Active Hamstring Stretch  30 seconds;1 rep    Lower Trunk Rotation  3 reps;20 seconds    Prone on Elbows Stretch  3 reps    Prone on Elbows Stretch Limitations  pain in R gluteal region (did not add for HEP)    Quadruped Mid Back Stretch  20 seconds    Piriformis Stretch  2 reps;30 seconds             PT Education - 01/03/20 1039    Education Details  HEP    Person(s) Educated  Patient    Methods  Explanation;Demonstration;Handout    Comprehension  Returned demonstration;Verbalized understanding          PT Long Term Goals - 01/03/20 1040      PT LONG TERM  GOAL #1   Title  The patient will return demo HEP independently.    Time  6    Period  Weeks    Target Date  02/17/20      PT LONG TERM GOAL #2   Title  The patient will reduce functional limitation per FOTO survey from 65% limited to 45% limited    Time  6    Period  Weeks    Target Date  02/17/20      PT LONG  TERM GOAL #3   Title  The patient will improve ROM lumbar spine to forward flex to reach the floor for handling pet care and functional lifting in the home.    Time  6    Period  Weeks      PT LONG TERM GOAL #4   Title  The patient will tolerate standing activiites x 20 minutes with change in pain <2/10 from baseline.    Time  6    Period  Weeks    Target Date  02/17/20             Plan - 01/03/20 1027    Clinical Impression Statement  The patient is a 54 year old female presenting to OP physical therapy with chronic low back pain that worsened in October 2020.  She presents with mid to low back pain that radiates into the R leg at times, LE weakness, sensory changes in her mid back (most likely due to rounding and dec'd joint mobility placing pressure on surrounding tissue), and decreased flexibility.  Impairments limit her ability to perform ADLs, IADLs, and work related tasks.  PT to address deficits to optimize mobility and reduce pain for improved quality of life.    Personal Factors and Comorbidities  Comorbidity 1;Comorbidity 2    Comorbidities  obesity, psoriatic arthritis    Examination-Activity Limitations  Bed Mobility;Locomotion Level;Lift;Stairs;Squat    Examination-Participation Restrictions  Cleaning;Meal Prep;Laundry    Stability/Clinical Decision Making  Evolving/Moderate complexity    Clinical Decision Making  Moderate    Rehab Potential  Good    PT Frequency  1x / week    PT Duration  6 weeks    PT Treatment/Interventions  ADLs/Self Care Home Management;Iontophoresis 4mg /ml Dexamethasone;Cryotherapy;Electrical Stimulation;Traction;Gait training;Stair training;Functional mobility training;Therapeutic activities;Therapeutic exercise;Balance training;Neuromuscular re-education;Manual techniques;Patient/family education;Dry needling;Taping;Spinal Manipulations    PT Next Visit Plan  progress core strengthening to tolerance, begin initiating home walking program  (currently walks dog intermittently--work towards sustained walking for health benefits);  LE strengthening, thoracic PA joint mobility (mid t-spine), postural strengtheningl    PT Home Exercise Plan  Access Code: [email or web address]       Patient will benefit from skilled therapeutic intervention in order to improve the following deficits and impairments:  Decreased range of motion, Impaired sensation, Impaired flexibility, Decreased strength, Decreased mobility, Obesity, Pain, Improper body mechanics, Postural dysfunction, Increased muscle spasms, Decreased activity tolerance  Visit Diagnosis: Acute right-sided low back pain, unspecified whether sciatica present  Muscle weakness (generalized)  Abnormal posture  Other symptoms and signs involving the musculoskeletal system     Problem List Patient Active Problem List   Diagnosis Date Noted  . Facet arthritis of lumbar region 11/21/2019  . DDD (degenerative disc disease), lumbar 11/21/2019  . Muscle spasm 11/21/2019  . Right leg weakness 11/21/2019  . Other intervertebral disc degeneration, lumbar region 11/21/2019  . Chronic right-sided low back pain with right-sided sciatica 11/20/2019  . B12 deficiency 07/03/2019  . Vitamin deficiency 07/03/2019  .  Varicose veins of lower extremity 06/30/2019  . Morbid obesity (HCC) 01/25/2017  . Psoriatic arthritis (HCC) 01/22/2017    Willa Brocks, PT 01/03/2020, 12:32 PM  [facility] [address] [place], [place], [address] Phone: [phone]   Fax:  [phone]  Name: Sharon Tyler MRN: [id number] Date of Birth: 23-May-1966

## 2020-01-10 ENCOUNTER — Other Ambulatory Visit: Payer: Self-pay

## 2020-01-10 ENCOUNTER — Encounter: Payer: Self-pay | Admitting: Rehabilitative and Restorative Service Providers"

## 2020-01-10 ENCOUNTER — Ambulatory Visit (INDEPENDENT_AMBULATORY_CARE_PROVIDER_SITE_OTHER): Payer: BC Managed Care – PPO | Admitting: Rehabilitative and Restorative Service Providers"

## 2020-01-10 DIAGNOSIS — M545 Low back pain, unspecified: Secondary | ICD-10-CM

## 2020-01-10 DIAGNOSIS — R29898 Other symptoms and signs involving the musculoskeletal system: Secondary | ICD-10-CM

## 2020-01-10 DIAGNOSIS — R293 Abnormal posture: Secondary | ICD-10-CM

## 2020-01-10 DIAGNOSIS — M6281 Muscle weakness (generalized): Secondary | ICD-10-CM | POA: Diagnosis not present

## 2020-01-10 NOTE — Patient Instructions (Signed)
Access Code: [email or web address]  URL: [email or web address]  Date: 01/10/2020  Prepared by: Margretta Ditty   Exercises Supine Lower Trunk Rotation - 5 reps - 1 sets - 15 seconds hold - 2x daily - 7x weekly Supine Piriformis Stretch with Foot on Ground - 3 reps - 1 sets - 15 seconds hold - 2x daily - 7x weekly Supine March - 10 reps - 1 sets - 2x daily - 7x weekly Cervical Retraction Prone on Elbows - 10 reps - 1 sets - 2x daily - 7x weekly Seated Hamstring Stretch with Chair - 3 reps - 1 sets - 30 seconds hold - 2x daily - 7x weekly Squat with Chair Touch - 10 reps - 1 sets - 2x daily - 7x weekly Seated Quadratus Lumborum Stretch with Arm Overhead - 3 reps                   - 1 sets - 20 seconds hold - 2x daily - 7x weekly Shoulder Overhead Press in Flexion with Dumbbells - 10 reps - 1 sets - 2x daily - 7x weekly

## 2020-01-10 NOTE — Therapy (Signed)
[place], [place], [address] Phone: [phone]   Fax:  [phone]  Physical Therapy Treatment  Patient Details  Name: Sharon Tyler MRN: [id number] Date of Birth: 1966-02-25 Referring Provider (PT): Silverio Decamp, MD   Encounter Date: 01/10/2020  PT End of Session - 01/10/20 1208    Visit Number  2    Number of Visits  12    Date for PT Re-Evaluation  02/17/20    Authorization Type  bcbs    PT Start Time  1020    PT Stop Time  1100    PT Time Calculation (min)  40 min    Activity Tolerance  Patient tolerated treatment well;No increased pain    Behavior During Therapy  [facility] for tasks assessed/performed       History reviewed. No pertinent past medical history.  History reviewed. No pertinent surgical history.  There were no vitals filed for this visit.  Subjective Assessment - 01/10/20 1021    Subjective  The patient reports she slipped last week when helping her mom into the trailer after a medial appointment.  She did her exercises sitting up b/c of R sided pain that began with slipping last week.  The weather is also aggravating pain today due to arthritis.    Pertinent History  psoriatic arthritis (dx since 1998),    Diagnostic tests  IMPRESSION:1. Severe left facet arthrosis at L5-S1.2. Small left foraminal disc protrusions at L2-3 and L3-4 withoutstenosis.    Patient Stated Goals  No pain.    Currently in Pain?  Yes    Pain Score  6     Pain Location  Back    Pain Orientation  Mid;Lower    Pain Descriptors / Indicators  Aching    Pain Type  Chronic pain    Pain Onset  More than a month ago    Pain Frequency  Constant    Aggravating Factors   worse with standing    Pain Relieving Factors  rest                       [facility] Adult PT Treatment/Exercise - 01/10/20 1024      Ambulation/Gait   Ambulation/Gait  Yes    Ambulation/Gait Assistance  7: Independent     Ambulation Distance (Feet)  100 Feet    Assistive device  None    Gait Comments  More antalgic pattern today on R leg due to slipping at her mom's on Friday.      Exercises   Exercises  Lumbar      Lumbar Exercises: Stretches   Lower Trunk Rotation  2 reps    Other Lumbar Stretch Exercise  quadratus lumborum stretch sidelying with pillow under ribs for greater stretch    Other Lumbar Stretch Exercise  standing quadratus lumborum stretch and then seated *provided seated for HEP due to ease of performance      Lumbar Exercises: Standing   Functional Squats  10 reps    Functional Squats Limitations  cues on technique; moved to wall bump for support and squat to hit chair    Lifting  to overhead;10 reps;Weights    Lifting Weights (lbs)  2      Lumbar Exercises: Supine   Bridge  10 reps;Non-compliant    Bridge Limitations  initially gets HS tightness/spasm      Lumbar Exercises: Prone   Other Prone Lumbar Exercises  Prone lateral  elbow rocking, prock cervical axial extension x 5 reps, prone alternating UE reaching R and L x 5 reps.      Manual Therapy   Manual Therapy  Joint mobilization;Soft tissue mobilization             PT Education - 01/10/20 1208    Education Details  HEP progression    Person(s) Educated  Patient    Methods  Explanation;Demonstration;Handout    Comprehension  Verbalized understanding;Returned demonstration          PT Long Term Goals - 01/03/20 1040      PT LONG TERM GOAL #1   Title  The patient will return demo HEP independently.    Time  6    Period  Weeks    Target Date  02/17/20      PT LONG TERM GOAL #2   Title  The patient will reduce functional limitation per FOTO survey from 65% limited to 45% limited    Time  6    Period  Weeks    Target Date  02/17/20      PT LONG TERM GOAL #3   Title  The patient will improve ROM lumbar spine to forward flex to reach the floor for handling pet care and functional lifting in the home.     Time  6    Period  Weeks      PT LONG TERM GOAL #4   Title  The patient will tolerate standing activiites x 20 minutes with change in pain <2/10 from baseline.    Time  6    Period  Weeks    Target Date  02/17/20            Plan - 01/10/20 1210    Clinical Impression Statement  The patient is tolerating standing strengthening well today and PT was able to progress home exercises to add resistance activities to home.  She c/o tightness and pain with QL stretching and this was addressed in treatment today.  Continue working to [employer].    Personal Factors and Comorbidities  Comorbidity 1;Comorbidity 2    Comorbidities  obesity, psoriatic arthritis    Examination-Activity Limitations  Bed Mobility;Locomotion Level;Lift;Stairs;Squat    Examination-Participation Restrictions  Cleaning;Meal Prep;Laundry    Stability/Clinical Decision Making  Evolving/Moderate complexity    Rehab Potential  Good    PT Frequency  1x / week    PT Duration  6 weeks    PT Treatment/Interventions  ADLs/Self Care Home Management;Iontophoresis 4mg /ml Dexamethasone;Cryotherapy;Electrical Stimulation;Traction;Gait training;Stair training;Functional mobility training;Therapeutic activities;Therapeutic exercise;Balance training;Neuromuscular re-education;Manual techniques;Patient/family education;Dry needling;Taping;Spinal Manipulations    PT Next Visit Plan  progress core strengthening to tolerance, begin initiating home walking program (currently walks dog intermittently--work towards sustained walking for health benefits);  LE strengthening, thoracic PA joint mobility (mid t-spine), postural strengtheningl    PT Home Exercise Plan  Access Code: [email or web address]    Consulted and Agree with Plan of Care  Patient       Patient will benefit from skilled therapeutic intervention in order to improve the following deficits and impairments:  Decreased range of motion, Impaired sensation, Impaired flexibility, Decreased strength,  Decreased mobility, Obesity, Pain, Improper body mechanics, Postural dysfunction, Increased muscle spasms, Decreased activity tolerance  Visit Diagnosis: Acute right-sided low back pain, unspecified whether sciatica present  Muscle weakness (generalized)  Abnormal posture  Other symptoms and signs involving the musculoskeletal system     Problem List Patient Active Problem List   Diagnosis Date Noted  .  Facet arthritis of lumbar region 11/21/2019  . DDD (degenerative disc disease), lumbar 11/21/2019  . Muscle spasm 11/21/2019  . Right leg weakness 11/21/2019  . Other intervertebral disc degeneration, lumbar region 11/21/2019  . Chronic right-sided low back pain with right-sided sciatica 11/20/2019  . B12 deficiency 07/03/2019  . Vitamin deficiency 07/03/2019  . Varicose veins of lower extremity 06/30/2019  . Morbid obesity (HCC) 01/25/2017  . Psoriatic arthritis (HCC) 01/22/2017    Camerin Jimenez, PT 01/10/2020, 12:21 PM  [facility] [address] [place], [place], [address] Phone: [phone]   Fax:  [phone]  Name: Sharon Tyler MRN: [id number] Date of Birth: 1966-03-27

## 2020-01-11 ENCOUNTER — Encounter: Payer: Self-pay | Admitting: Physician Assistant

## 2020-01-15 NOTE — Telephone Encounter (Signed)
Ok for work note continue to be out of work due to low back pain until seen by sports medicine Dr. Karie Schwalbe for follow up after initial treatment.

## 2020-01-17 ENCOUNTER — Encounter: Payer: Self-pay | Admitting: Rehabilitative and Restorative Service Providers"

## 2020-01-17 ENCOUNTER — Ambulatory Visit (INDEPENDENT_AMBULATORY_CARE_PROVIDER_SITE_OTHER): Payer: BC Managed Care – PPO | Admitting: Rehabilitative and Restorative Service Providers"

## 2020-01-17 ENCOUNTER — Other Ambulatory Visit: Payer: Self-pay

## 2020-01-17 DIAGNOSIS — R29898 Other symptoms and signs involving the musculoskeletal system: Secondary | ICD-10-CM

## 2020-01-17 DIAGNOSIS — M6281 Muscle weakness (generalized): Secondary | ICD-10-CM | POA: Diagnosis not present

## 2020-01-17 DIAGNOSIS — M545 Low back pain, unspecified: Secondary | ICD-10-CM

## 2020-01-17 DIAGNOSIS — R293 Abnormal posture: Secondary | ICD-10-CM

## 2020-01-17 NOTE — Therapy (Signed)
[place], [place], [address] Phone: [phone]   Fax:  [phone]  Physical Therapy Treatment  Patient Details  Name: Sharon Tyler MRN: [id number] Date of Birth: 1966/05/02 Referring Provider (PT): Silverio Decamp, MD   Encounter Date: 01/17/2020  PT End of Session - 01/17/20 1021    Visit Number  3    Number of Visits  12    Date for PT Re-Evaluation  02/17/20    Authorization Type  bcbs    PT Start Time  1018    PT Stop Time  1100    PT Time Calculation (min)  42 min    Activity Tolerance  Patient tolerated treatment well;No increased pain    Behavior During Therapy  [facility] for tasks assessed/performed       History reviewed. No pertinent past medical history.  History reviewed. No pertinent surgical history.  There were no vitals filed for this visit.  Subjective Assessment - 01/17/20 1019    Subjective  The patient feels that she has increased pain in her thoracolumbar junction.  She feels like the pain is changing with exercise.  She felt like she released the tightness she got after slipping at her mom's house.    Pertinent History  psoriatic arthritis (dx since 1998),    Diagnostic tests  IMPRESSION:1. Severe left facet arthrosis at L5-S1.2. Small left foraminal disc protrusions at L2-3 and L3-4 withoutstenosis.    Patient Stated Goals  No pain.    Currently in Pain?  Yes    Pain Score  8     Pain Location  Back    Pain Orientation  Mid;Lower    Pain Descriptors / Indicators  Sore    Pain Type  Chronic pain    Pain Onset  More than a month ago    Pain Frequency  Constant    Aggravating Factors   rotation hurts in low t-spine and L spine    Pain Relieving Factors  "it feels knotty"                       [facility] Adult PT Treatment/Exercise - 01/17/20 1031      Ambulation/Gait   Ambulation/Gait  Yes    Ambulation/Gait Assistance  7: Independent    Ambulation  Distance (Feet)  300 Feet    Assistive device  None      Exercises   Exercises  Lumbar      Lumbar Exercises: Stretches   Lower Trunk Rotation  5 reps    Hip Flexor Stretch  2 reps;30 seconds    Piriformis Stretch  2 reps;Right;Left;30 seconds    Piriformis Stretch Limitations  then leg cross with trunk rotation    Other Lumbar Stretch Exercise  sidelying quadratus lumborum stretch      Lumbar Exercises: Aerobic   Stationary Bike  L3 x 3 minutes, then walked for warm up      Lumbar Exercises: Standing   Functional Squats  5 reps    Functional Squats Limitations  modified from squat with chair bump to wall slide x 5 reps      Lumbar Exercises: Supine   Bridge  Non-compliant;10 reps    Bridge Limitations  improved from prior attempts    Straight Leg Raise  5 reps    Other Supine Lumbar Exercises  bilateral legs in 90/90 flexion with toe taps for core stability    Other Supine Lumbar Exercises  hip hiking/depression x 10 reps with LE on ball      Manual Therapy   Manual Therapy  Joint mobilization;Soft tissue mobilization    Manual therapy comments  to reduce pain    Joint Mobilization  PA glides II-III thoracic spine mid and lower, lumbar spine PA grade II-III,     Soft tissue mobilization  QL in sidelying STM and erector spinae STM             PT Education - 01/17/20 1327    Education Details  HEPupdated    Person(s) Educated  Patient    Methods  Explanation;Demonstration;Handout    Comprehension  Verbalized understanding;Returned demonstration          PT Long Term Goals - 01/03/20 1040      PT LONG TERM GOAL #1   Title  The patient will return demo HEP independently.    Time  6    Period  Weeks    Target Date  02/17/20      PT LONG TERM GOAL #2   Title  The patient will reduce functional limitation per FOTO survey from 65% limited to 45% limited    Time  6    Period  Weeks    Target Date  02/17/20      PT LONG TERM GOAL #3   Title  The patient will  improve ROM lumbar spine to forward flex to reach the floor for handling pet care and functional lifting in the home.    Time  6    Period  Weeks      PT LONG TERM GOAL #4   Title  The patient will tolerate standing activiites x 20 minutes with change in pain <2/10 from baseline.    Time  6    Period  Weeks    Target Date  02/17/20            Plan - 01/17/20 1102    Clinical Impression Statement  The patient is continuing to progress with therapeutic exercise. PT modified squat for home program.  After stretching, she had tightness and "pinching" in R SI joint with mild relief after seated piriformis stretch (modified with PT helping).  We discussed how increasing mobility may lead to other areas becoming inflammed and we can modify program as needed.    Personal Factors and Comorbidities  Comorbidity 1;Comorbidity 2    Comorbidities  obesity, psoriatic arthritis    Examination-Activity Limitations  Bed Mobility;Locomotion Level;Lift;Stairs;Squat    Examination-Participation Restrictions  Cleaning;Meal Prep;Laundry    Stability/Clinical Decision Making  Evolving/Moderate complexity    Rehab Potential  Good    PT Frequency  1x / week    PT Duration  6 weeks    PT Treatment/Interventions  ADLs/Self Care Home Management;Iontophoresis 4mg /ml Dexamethasone;Cryotherapy;Electrical Stimulation;Traction;Gait training;Stair training;Functional mobility training;Therapeutic activities;Therapeutic exercise;Balance training;Neuromuscular re-education;Manual techniques;Patient/family education;Dry needling;Taping;Spinal Manipulations    PT Next Visit Plan  DRY NEEDLING ASSESSMENT, progress core strengthening to tolerance, begin initiating home walking program (currently walks dog intermittently--work towards sustained walking for health benefits);  LE strengthening, thoracic PA joint mobility (mid t-spine), postural strengtheningl    PT Home Exercise Plan  Access Code: [email or web address]    Consulted and Agree  with Plan of Care  Patient       Patient will benefit from skilled therapeutic intervention in order to improve the following deficits and impairments:  Decreased range of motion, Impaired sensation, Impaired flexibility, Decreased strength, Decreased mobility, Obesity, Pain, Improper body mechanics, Postural  dysfunction, Increased muscle spasms, Decreased activity tolerance  Visit Diagnosis: Acute right-sided low back pain, unspecified whether sciatica present  Muscle weakness (generalized)  Other symptoms and signs involving the musculoskeletal system  Abnormal posture     Problem List Patient Active Problem List   Diagnosis Date Noted  . Facet arthritis of lumbar region 11/21/2019  . DDD (degenerative disc disease), lumbar 11/21/2019  . Muscle spasm 11/21/2019  . Right leg weakness 11/21/2019  . Other intervertebral disc degeneration, lumbar region 11/21/2019  . Chronic right-sided low back pain with right-sided sciatica 11/20/2019  . B12 deficiency 07/03/2019  . Vitamin deficiency 07/03/2019  . Varicose veins of lower extremity 06/30/2019  . Morbid obesity (HCC) 01/25/2017  . Psoriatic arthritis (HCC) 01/22/2017    Sharon Tyler, PT 01/17/2020, 1:31 PM  [facility] [address] [place], [place], [address] Phone: [phone]   Fax:  [phone]  Name: Sinead Hockman MRN: [id number] Date of Birth: Jun 01, 1966

## 2020-01-17 NOTE — Patient Instructions (Signed)
Access Code: [email or web address]  URL: [email or web address]  Date: 01/17/2020  Prepared by: Margretta Ditty   Exercises Supine Lower Trunk Rotation - 5 reps - 1 sets - 15 seconds hold - 2x daily - 7x weekly Supine Piriformis Stretch with Foot on Ground - 3 reps - 1 sets - 15 seconds hold - 2x daily - 7x weekly Supine March - 10 reps - 1 sets - 2x daily - 7x weekly Cervical Retraction Prone on Elbows - 10 reps - 1 sets - 2x daily - 7x weekly Seated Hamstring Stretch with Chair - 3 reps - 1 sets - 30 seconds hold - 2x daily - 7x weekly Seated Quadratus Lumborum Stretch with Arm Overhead - 3 reps                   - 1 sets - 20 seconds hold - 2x daily - 7x weekly Shoulder Overhead Press in Flexion with Dumbbells - 10 reps - 1 sets - 2x daily - 7x weekly Wall Quarter Squat - 10 reps - 1 sets - 2x daily - 7x weekly

## 2020-01-24 ENCOUNTER — Ambulatory Visit (INDEPENDENT_AMBULATORY_CARE_PROVIDER_SITE_OTHER): Payer: BC Managed Care – PPO | Admitting: Physical Therapy

## 2020-01-24 ENCOUNTER — Encounter: Payer: Self-pay | Admitting: Physical Therapy

## 2020-01-24 ENCOUNTER — Other Ambulatory Visit: Payer: Self-pay

## 2020-01-24 DIAGNOSIS — M6281 Muscle weakness (generalized): Secondary | ICD-10-CM

## 2020-01-24 DIAGNOSIS — M545 Low back pain, unspecified: Secondary | ICD-10-CM

## 2020-01-24 DIAGNOSIS — R293 Abnormal posture: Secondary | ICD-10-CM | POA: Diagnosis not present

## 2020-01-24 DIAGNOSIS — R29898 Other symptoms and signs involving the musculoskeletal system: Secondary | ICD-10-CM

## 2020-01-24 NOTE — Therapy (Signed)
[facility] [place] [address] [place], [place], [address] Phone: [phone]   Fax:  [phone]  Physical Therapy Treatment  Patient Details  Name: Sharon Tyler MRN: [id number] Date of Birth: [birth year] Referring Provider (PT): Monica Becton, MD   Encounter Date: 01/24/2020  PT End of Session - 01/24/20 1101    Visit Number  4    Number of Visits  12    Date for PT Re-Evaluation  02/17/20    Authorization Type  bcbs    PT Start Time  1017    PT Stop Time  1102    PT Time Calculation (min)  45 min    Activity Tolerance  Patient tolerated treatment well;No increased pain    Behavior During Therapy  [facility] for tasks assessed/performed       History reviewed. No pertinent past medical history.  History reviewed. No pertinent surgical history.  There were no vitals filed for this visit.  Subjective Assessment - 01/24/20 1019    Subjective  Patient reports feeling tender today, rating pain 8/10.    Pertinent History  psoriatic arthritis (dx since 1998),    Diagnostic tests  IMPRESSION:1. Severe left facet arthrosis at L5-S1.2. Small left foraminal disc protrusions at L2-3 and L3-4 withoutstenosis.    Currently in Pain?  Yes    Pain Score  8     Pain Location  Back    Pain Orientation  Mid;Lower    Pain Descriptors / Indicators  Sore    Pain Type  Chronic pain                       [facility] Adult PT Treatment/Exercise - 01/24/20 0001      Lumbar Exercises: Stretches   Single Knee to Chest Stretch  Right;Left;1 rep;20 seconds    Lower Trunk Rotation  5 reps      Lumbar Exercises: Aerobic   Stationary Bike  L3 x 3 minutes, then walked for warm up   pt reports knee pain at 3 min     Lumbar Exercises: Supine   Ab Set  5 reps;5 seconds    AB Set Limitations  reviewed TA contraction without post pelvic tilt    Bent Knee Raise  5 reps   bil   Bent Knee Raise Limitations  then 10 reps of  up/up/down/down    Bridge  Non-compliant;10 reps      Manual Therapy   Manual Therapy  Soft tissue mobilization    Manual therapy comments  skilled palpation and monitoring of soft tissue during DN      Soft tissue mobilization  to lumbar/thoracic paraspinals       Trigger Point Dry Needling - 01/24/20 0001    Consent Given?  Yes    Education Handout Provided  Yes    Muscles Treated Back/Hip  Gluteus minimus;Gluteus medius;Gluteus maximus;Piriformis;Lumbar multifidi;Thoracic multifidi    Dry Needling Comments  right hip    Gluteus Minimus Response  Twitch response elicited;Palpable increased muscle length    Gluteus Medius Response  Twitch response elicited;Palpable increased muscle length    Gluteus Maximus Response  Twitch response elicited;Palpable increased muscle length    Piriformis Response  Twitch response elicited;Palpable increased muscle length    Lumbar multifidi Response  Twitch response elicited;Palpable increased muscle length    Thoracic multifidi response  Twitch response elicited;Palpable increased muscle length           PT Education - 01/24/20  1521    Education Details  DN education and aftercare    Person(s) Educated  Patient    Methods  Explanation;Handout    Comprehension  Verbalized understanding          PT Long Term Goals - 01/03/20 1040      PT LONG TERM GOAL #1   Title  The patient will return demo HEP independently.    Time  6    Period  Weeks    Target Date  02/17/20      PT LONG TERM GOAL #2   Title  The patient will reduce functional limitation per FOTO survey from 65% limited to 45% limited    Time  6    Period  Weeks    Target Date  02/17/20      PT LONG TERM GOAL #3   Title  The patient will improve ROM lumbar spine to forward flex to reach the floor for handling pet care and functional lifting in the home.    Time  6    Period  Weeks      PT LONG TERM GOAL #4   Title  The patient will tolerate standing activiites x 20  minutes with change in pain <2/10 from baseline.    Time  6    Period  Weeks    Target Date  02/17/20            Plan - 01/24/20 1525    Clinical Impression Statement  Patient demonstratiing good TA contraction with abdominal exercises with no pain reported. She has significant tightness in lumbar and thoracic paraspinals as well as right gluteals. Very good response to DN throughtout with significant decrease in tissue tension in paraspinals and right gluteals. Patient will likely benefit from further DN in these areas. LTGs are ongoing.    PT Frequency  1x / week    PT Duration  6 weeks    PT Treatment/Interventions  ADLs/Self Care Home Management;Iontophoresis 4mg /ml Dexamethasone;Cryotherapy;Electrical Stimulation;Traction;Gait training;Stair training;Functional mobility training;Therapeutic activities;Therapeutic exercise;Balance training;Neuromuscular re-education;Manual techniques;Patient/family education;Dry needling;Taping;Spinal Manipulations    PT Next Visit Plan  assess DN response cont as indicated, progress core strengthening to tolerance, begin initiating home walking program (currently walks dog intermittently--work towards sustained walking for health benefits);  LE strengthening, thoracic PA joint mobility (mid t-spine), postural strengtheningl    PT Home Exercise Plan  Access Code: [email or web address]    Consulted and Agree with Plan of Care  Patient       Patient will benefit from skilled therapeutic intervention in order to improve the following deficits and impairments:  Decreased range of motion, Impaired sensation, Impaired flexibility, Decreased strength, Decreased mobility, Obesity, Pain, Improper body mechanics, Postural dysfunction, Increased muscle spasms, Decreased activity tolerance  Visit Diagnosis: Acute right-sided low back pain, unspecified whether sciatica present  Muscle weakness (generalized)  Other symptoms and signs involving the musculoskeletal  system  Abnormal posture     Problem List Patient Active Problem List   Diagnosis Date Noted  . Facet arthritis of lumbar region 11/21/2019  . DDD (degenerative disc disease), lumbar 11/21/2019  . Muscle spasm 11/21/2019  . Right leg weakness 11/21/2019  . Other intervertebral disc degeneration, lumbar region 11/21/2019  . Chronic right-sided low back pain with right-sided sciatica 11/20/2019  . B12 deficiency 07/03/2019  . Vitamin deficiency 07/03/2019  . Varicose veins of lower extremity 06/30/2019  . Morbid obesity (HCC) 01/25/2017  . Psoriatic arthritis (HCC) 01/22/2017    03/22/2017 PT 01/24/2020, 3:29  [place], [place], [address] Phone: [phone]   Fax:  [phone]  Name: Gresia Isidoro MRN: [id number] Date of Birth: 05/16/1966

## 2020-01-31 ENCOUNTER — Other Ambulatory Visit: Payer: Self-pay

## 2020-01-31 ENCOUNTER — Ambulatory Visit: Payer: BC Managed Care – PPO | Admitting: Rehabilitative and Restorative Service Providers"

## 2020-01-31 ENCOUNTER — Encounter: Payer: Self-pay | Admitting: Rehabilitative and Restorative Service Providers"

## 2020-01-31 DIAGNOSIS — M6281 Muscle weakness (generalized): Secondary | ICD-10-CM | POA: Diagnosis not present

## 2020-01-31 DIAGNOSIS — R293 Abnormal posture: Secondary | ICD-10-CM | POA: Diagnosis not present

## 2020-01-31 DIAGNOSIS — M545 Low back pain, unspecified: Secondary | ICD-10-CM

## 2020-01-31 DIAGNOSIS — R29898 Other symptoms and signs involving the musculoskeletal system: Secondary | ICD-10-CM | POA: Diagnosis not present

## 2020-01-31 NOTE — Therapy (Addendum)
[place], [place], [address] Phone: [phone]   Fax:  [phone]  Physical Therapy Treatment  Patient Details  Name: Sharon Tyler MRN: [id number] Date of Birth: 21-Apr-1966 Referring Provider (PT): Silverio Decamp, MD   Encounter Date: 01/31/2020  PT End of Session - 01/31/20 1111    Visit Number  5    Number of Visits  12    Date for PT Re-Evaluation  02/17/20    PT Start Time  1100    PT Stop Time  1146    PT Time Calculation (min)  46 min       History reviewed. No pertinent past medical history.  History reviewed. No pertinent surgical history.  There were no vitals filed for this visit.  Subjective Assessment - 01/31/20 1112    Subjective  Did well iwth DN with decreased pain in shoulders and improved shoulder movement. LBP continues but it is not as bad. Has done some walking as weather permitted. Did march in place when she could not get out. Tightness in the Rt foot released after needling. Can now put her sock on in sitting.    Currently in Pain?  Yes    Pain Score  6     Pain Location  Back    Pain Orientation  Mid;Lower    Pain Descriptors / Indicators  Aching;Sore    Pain Type  Chronic pain    Pain Onset  More than a month ago    Pain Frequency  Constant                       [facility] Adult PT Treatment/Exercise - 01/31/20 0001      Lumbar Exercises: Aerobic   Nustep  L4 x 5 min LE only - some 'clicking' Rt knee - no report of increased pain       Lumbar Exercises: Supine   Ab Set  5 reps;5 seconds      Manual Therapy   Manual Therapy  Soft tissue mobilization    Manual therapy comments  skilled palpation and monitoring of soft tissue during DN      Soft tissue mobilization  lumbar/thoracic paraspinals into bilat posterior hips - tightness noted through the piriformis and glut med bilat        Trigger Point Dry Needling - 01/31/20 0001    Consent  Given?  Yes    Education Handout Provided  Previously provided    Dry Needling Comments  pt prone - reported some LB discomfort in prone even with pillow under abdomen     Gluteus Medius Response  Palpable increased muscle length    Gluteus Maximus Response  Palpable increased muscle length    Piriformis Response  Palpable increased muscle length    Lumbar multifidi Response  Palpable increased muscle length    Thoracic multifidi response  Palpable increased muscle length           PT Education - 01/31/20 1555    Education Details  Patient encouraged to work on core strengthening; walk 10-15 min each day even if in her house - should walk and not march in place    Person(s) Educated  Patient    Methods  Explanation    Comprehension  Verbalized understanding          PT Long Term Goals - 01/03/20 1040      PT LONG TERM GOAL #1   Title  The patient will return demo HEP independently.    Time  6    Period  Weeks    Target Date  02/17/20      PT LONG TERM GOAL #2   Title  The patient will reduce functional limitation per FOTO survey from 65% limited to 45% limited    Time  6    Period  Weeks    Target Date  02/17/20      PT LONG TERM GOAL #3   Title  The patient will improve ROM lumbar spine to forward flex to reach the floor for handling pet care and functional lifting in the home.    Time  6    Period  Weeks      PT LONG TERM GOAL #4   Title  The patient will tolerate standing activiites x 20 minutes with change in pain <2/10 from baseline.    Time  6    Period  Weeks    Target Date  02/17/20            Plan - 01/31/20 1556    Clinical Impression Statement  Patient reports continued generalized body aches and pains related to her arthritis. She goes for monthly arthritis dryg therapy tomorrow. Patient does report positive response to DN from last session. She has improved mobility through the LE's and can put her socks on more easily. Tight band which she had  noticed in the arch of her Rt foot has resolved. She continues to have thoracic back pain, LBP and bilat hip pain. Patiebt will benefit from continued PT to address problems identified. She will benefit from continued DN as well as core stabilization    PT Frequency  1x / week    PT Duration  6 weeks    PT Treatment/Interventions  ADLs/Self Care Home Management;Iontophoresis 69m/ml Dexamethasone;Cryotherapy;Electrical Stimulation;Traction;Gait training;Stair training;Functional mobility training;Therapeutic activities;Therapeutic exercise;Balance training;Neuromuscular re-education;Manual techniques;Patient/family education;Dry needling;Taping;Spinal Manipulations    PT Next Visit Plan  continue DN as indicated, progress core strengthening to tolerance, begin initiating home walking program (currently walks dog intermittently--work towards sustained walking for health benefits);  LE strengthening, thoracic PA joint mobility (mid t-spine), postural strengtheningl    PT Home Exercise Plan  Access Code: [email or web address]    Consulted and Agree with Plan of Care  Patient       Patient will benefit from skilled therapeutic intervention in order to improve the following deficits and impairments:  Decreased range of motion, Impaired sensation, Impaired flexibility, Decreased strength, Decreased mobility, Obesity, Pain, Improper body mechanics, Postural dysfunction, Increased muscle spasms, Decreased activity tolerance  Visit Diagnosis: Acute right-sided low back pain, unspecified whether sciatica present  Muscle weakness (generalized)  Other symptoms and signs involving the musculoskeletal system  Abnormal posture     Problem List Patient Active Problem List   Diagnosis Date Noted  . Facet arthritis of lumbar region 11/21/2019  . DDD (degenerative disc disease), lumbar 11/21/2019  . Muscle spasm 11/21/2019  . Right leg weakness 11/21/2019  . Other intervertebral disc degeneration, lumbar region  11/21/2019  . Chronic right-sided low back pain with right-sided sciatica 11/20/2019  . B12 deficiency 07/03/2019  . Vitamin deficiency 07/03/2019  . Varicose veins of lower extremity 06/30/2019  . Morbid obesity ([place] 01/25/2017  . Psoriatic arthritis ([facility] 01/22/2017    Kisa Fujii PNilda SimmerPT, MPH  01/31/2020, 4:02 PM  [facility] [address]Phone: [phone]  Fax:  [phone]  Name: Sharon Tyler MRN: [id number] Date of Birth: [birth year]  PHYSICAL THERAPY DISCHARGE SUMMARY  Visits from Start of Care: 5  Current functional level related to goals / functional outcomes: See last progress note for discharge status    Remaining deficits: Unknown    Education / Equipment: HEP  Plan: Patient agrees to discharge.  Patient goals were partially met. Patient is being discharged due to not returning since the last visit.  ?????     Reznor Ferrando P. Helene Kelp PT, MPH 03/11/20 2:58 PM

## 2020-02-07 ENCOUNTER — Other Ambulatory Visit: Payer: Self-pay

## 2020-02-07 ENCOUNTER — Ambulatory Visit (INDEPENDENT_AMBULATORY_CARE_PROVIDER_SITE_OTHER): Payer: BC Managed Care – PPO | Admitting: Sports Medicine

## 2020-02-07 DIAGNOSIS — G8929 Other chronic pain: Secondary | ICD-10-CM

## 2020-02-07 DIAGNOSIS — M5441 Lumbago with sciatica, right side: Secondary | ICD-10-CM | POA: Diagnosis not present

## 2020-02-07 NOTE — Progress Notes (Signed)
    Procedures performed today:    None.  Independent interpretation of tests performed by another provider:   None.  Impression and Recommendations:    Chronic right-sided low back pain with right-sided sciatica Sharon Tyler returns, she is a pleasant 54 year old female with chronic low back pain, localized to the right sacroiliac joint, she responded extremely well to formal physical therapy and dry needling both in her low back as well as her upper back and periscapular region. She is not completely better and does not feel as though she can return to work where she has to lift 50 pounds for 10-hour days. To continue her out of work for at least another month and then she can return, continue therapy for now, and she will likely be progressed to home exercise program, return to see me as needed. Of note she did have severe left L5-S1 facet arthritis on her MRI but next to no left-sided lower back symptoms. If persistent discomfort we can consider a sacroiliac joint injection under guidance.    ___________________________________________ Ihor Austin. Benjamin Stain, M.D., ABFM., CAQSM. Primary Care and Sports Medicine [facility]  Adjunct Instructor of Family Medicine  [employer]

## 2020-02-07 NOTE — Assessment & Plan Note (Signed)
Sharon Tyler returns, she is a pleasant 54 year old female with chronic low back pain, localized to the right sacroiliac joint, she responded extremely well to formal physical therapy and dry needling both in her low back as well as her upper back and periscapular region. She is not completely better and does not feel as though she can return to work where she has to lift 50 pounds for 10-hour days. To continue her out of work for at least another month and then she can return, continue therapy for now, and she will likely be progressed to home exercise program, return to see me as needed. Of note she did have severe left L5-S1 facet arthritis on her MRI but next to no left-sided lower back symptoms. If persistent discomfort we can consider a sacroiliac joint injection under guidance.

## 2020-03-05 ENCOUNTER — Other Ambulatory Visit: Payer: Self-pay

## 2020-03-05 ENCOUNTER — Ambulatory Visit (INDEPENDENT_AMBULATORY_CARE_PROVIDER_SITE_OTHER): Payer: BC Managed Care – PPO | Admitting: Physician Assistant

## 2020-03-05 VITALS — BP 141/67 | HR 90 | Temp 98.6°F | Ht 64.0 in | Wt 273.0 lb

## 2020-03-05 DIAGNOSIS — L4050 Arthropathic psoriasis, unspecified: Secondary | ICD-10-CM

## 2020-03-05 DIAGNOSIS — M47816 Spondylosis without myelopathy or radiculopathy, lumbar region: Secondary | ICD-10-CM | POA: Diagnosis not present

## 2020-03-05 DIAGNOSIS — F410 Panic disorder [episodic paroxysmal anxiety] without agoraphobia: Secondary | ICD-10-CM

## 2020-03-05 DIAGNOSIS — M5136 Other intervertebral disc degeneration, lumbar region: Secondary | ICD-10-CM

## 2020-03-05 DIAGNOSIS — M5441 Lumbago with sciatica, right side: Secondary | ICD-10-CM

## 2020-03-05 DIAGNOSIS — G8929 Other chronic pain: Secondary | ICD-10-CM
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
## 2020-03-13 ENCOUNTER — Encounter: Payer: Self-pay | Admitting: Physician Assistant

## 2020-03-13 DIAGNOSIS — F410 Panic disorder [episodic paroxysmal anxiety] without agoraphobia: Secondary | ICD-10-CM | POA: Insufficient documentation

## 2020-04-19 ENCOUNTER — Telehealth: Payer: Self-pay | Admitting: Neurology

## 2020-04-19 DIAGNOSIS — G8929 Other chronic pain: Secondary | ICD-10-CM

## 2020-04-19 NOTE — Telephone Encounter (Signed)
Patient left vm requesting referral for PT. Already seeing them, but states since stopped and started again they need new referral for their records. Referral placed.

## 2020-04-23 ENCOUNTER — Other Ambulatory Visit: Payer: Self-pay

## 2020-04-23 ENCOUNTER — Ambulatory Visit (INDEPENDENT_AMBULATORY_CARE_PROVIDER_SITE_OTHER): Payer: BC Managed Care – PPO | Admitting: Physical Therapy

## 2020-04-23 DIAGNOSIS — R29898 Other symptoms and signs involving the musculoskeletal system: Secondary | ICD-10-CM

## 2020-04-23 DIAGNOSIS — R293 Abnormal posture: Secondary | ICD-10-CM | POA: Diagnosis not present

## 2020-04-23 DIAGNOSIS — M6281 Muscle weakness (generalized): Secondary | ICD-10-CM

## 2020-04-23 DIAGNOSIS — M545 Low back pain, unspecified: Secondary | ICD-10-CM

## 2020-04-23 DIAGNOSIS — M62830 Muscle spasm of back: Secondary | ICD-10-CM
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
## 2020-05-03 ENCOUNTER — Other Ambulatory Visit: Payer: Self-pay

## 2020-05-03 ENCOUNTER — Ambulatory Visit (INDEPENDENT_AMBULATORY_CARE_PROVIDER_SITE_OTHER): Payer: BC Managed Care – PPO | Admitting: Rehabilitative and Restorative Service Providers"

## 2020-05-03 ENCOUNTER — Encounter: Payer: Self-pay | Admitting: Rehabilitative and Restorative Service Providers"

## 2020-05-03 DIAGNOSIS — M545 Low back pain, unspecified: Secondary | ICD-10-CM

## 2020-05-03 DIAGNOSIS — M62830 Muscle spasm of back: Secondary | ICD-10-CM

## 2020-05-03 DIAGNOSIS — R293 Abnormal posture: Secondary | ICD-10-CM | POA: Diagnosis not present

## 2020-05-03 DIAGNOSIS — R29898 Other symptoms and signs involving the musculoskeletal system: Secondary | ICD-10-CM

## 2020-05-03 DIAGNOSIS — M6281 Muscle weakness (generalized): Secondary | ICD-10-CM | POA: Diagnosis not present

## 2020-05-03 NOTE — Patient Instructions (Signed)
Access Code: [email or web address]: [email or web address]: 05/21/2021Prepared by: Inigo Lantigua HoltExercises  Standing Plank on Wall - 2-3 x daily - 7 x weekly - 1 sets - 3 reps - 30 sec hold Patient Education  Trigger Point Dry Needling

## 2020-05-03 NOTE — Therapy (Addendum)
[place] [facility] [place], [place], [address] Phone: [phone]   Fax:  [phone]  Physical Therapy Treatment  Patient Details  Name: Sharon Tyler MRN: [id number] Date of Birth: [birth year] Referring Provider (PT): Dr Dianah Field   Encounter Date: 05/03/2020  PT End of Session - 05/03/20 1022    Visit Number  2    Number of Visits  12    Date for PT Re-Evaluation  06/04/20    Authorization Type  BCBS    PT Start Time  1019    PT Stop Time  1103    PT Time Calculation (min)  44 min    Activity Tolerance  Patient tolerated treatment well       History reviewed. No pertinent past medical history.  History reviewed. No pertinent surgical history.  There were no vitals filed for this visit.  Subjective Assessment - 05/03/20 1022    Subjective  No change since she was here last week. The heels of her feet are hurting which is new. She has had it before but "it seems like it came from nowhere". Does remember that she has walked more; tried to vaccum and sweep which have increased pain.    Currently in Pain?  Yes    Pain Score  6     Pain Location  Back    Pain Orientation  Right;Left;Lower    Pain Descriptors / Indicators  Aching;Squeezing;Tightness    Pain Type  Chronic pain    Pain Onset  More than a month ago    Pain Frequency  Constant                        [facility] Adult PT Treatment/Exercise - 05/03/20 0001      Lumbar Exercises: Stretches   Prone on Elbows Stretch  1 rep;30 seconds      Lumbar Exercises: Aerobic   Nustep  L5 x 5.5 min LE only       Lumbar Exercises: Standing   Other Standing Lumbar Exercises  wall plank 30 sec x 2 VC to encourage core tightening       Manual Therapy   Manual therapy comments  skilled palpation and monitoring of soft tissue during DN      Joint Mobilization  lumbar PA mobs Grade II/III    Soft tissue mobilization  lumbar/thoracic paraspinals into  bilat posterior hips - tightness noted through the piriformis and glut med bilat     Myofascial Release  lumbar/thoracic paraspinals        Trigger Point Dry Needling - 05/03/20 0001    Consent Given?  Yes    Education Handout Provided  Yes    Dry Needling Comments  bilat     Electrical Stimulation Performed with Dry Needling  Yes    Gluteus Medius Response  Palpable increased muscle length    Gluteus Maximus Response  Palpable increased muscle length    Piriformis Response  Palpable increased muscle length    Erector spinae Response  Palpable increased muscle length    Lumbar multifidi Response  Palpable increased muscle length    Quadratus Lumborum Response  Palpable increased muscle length    Thoracic multifidi response  Palpable increased muscle length           PT Education - 05/03/20 1052    Education Details  DN HEP    Person(s) Educated  Patient    Methods  Explanation;Demonstration;Tactile cues;Verbal cues;Handout  Comprehension  Verbalized understanding;Returned demonstration;Verbal cues required;Tactile cues required          PT Long Term Goals - 04/23/20 1221      PT LONG TERM GOAL #1   Title  The patient will return demo HEP independently.    Time  6    Period  Weeks    Status  New    Target Date  06/04/20      PT LONG TERM GOAL #2   Title  The patient will reduce functional limitation per FOTO survey from 66% limited to 53% limited    Time  6    Period  Weeks    Status  New    Target Date  06/04/20      PT LONG TERM GOAL #3   Title  The patient will improve ROM lumbar spine to forward flex to reach the floor for handling pet care and functional lifting in the home.    Time  6    Period  Weeks    Status  New    Target Date  06/04/20      PT LONG TERM GOAL #4   Title  improve bilat LE strength to allow her to assist her mother as needed in the coming months    Time  6    Period  Weeks    Status  New    Target Date  06/04/20      PT LONG  TERM GOAL #5   Title  report =/>50% reduction in back pain to allow her to participate in some form of aerobic activity to assist with her weight loss goal    Time  6    Period  Weeks    Status  New    Target Date  06/04/20            Plan - 05/03/20 1100    Clinical Impression Statement  Patient reports difficulty with compliance with HEP due to family schedule and stress. She has persistent bilat LBP Rt > Lt with pain from the thoracic paraspinals through lumbar paraspinals into bilat posterior hips. Patient has pain in the anterior hip and bilat heels. Tolerated DN well. Needs to progress with core stabilization and strengthening.    Rehab Potential  Fair    PT Frequency  2x / week    PT Duration  6 weeks    PT Treatment/Interventions  Taping;Patient/family education;Functional mobility training;Moist Heat;Traction;Ultrasound;Therapeutic activities;Therapeutic exercise;Cryotherapy;Electrical Stimulation;Neuromuscular re-education;Manual techniques;Dry needling;Spinal Manipulations;Joint Manipulations    PT Next Visit Plan  assess response to DN to paraspinals, gluts, piriformis, progress core strengthening and stress reduction activities.    PT Home Exercise Plan  [email or web address]    Consulted and Agree with Plan of Care  Patient       Patient will benefit from skilled therapeutic intervention in order to improve the following deficits and impairments:     Visit Diagnosis: Acute right-sided low back pain, unspecified whether sciatica present  Muscle weakness (generalized)  Other symptoms and signs involving the musculoskeletal system  Abnormal posture  Muscle spasm of back     Problem List Patient Active Problem List   Diagnosis Date Noted  . Panic attack 03/13/2020  . Facet arthritis of lumbar region 11/21/2019  . DDD (degenerative disc disease), lumbar 11/21/2019  . Muscle spasm 11/21/2019  . Right leg weakness 11/21/2019  . Other intervertebral disc degeneration,  lumbar region 11/21/2019  . Chronic right-sided low back pain with right-sided sciatica 11/20/2019  .  B12 deficiency 07/03/2019  . Vitamin deficiency 07/03/2019  . Varicose veins of lower extremity 06/30/2019  . Morbid obesity ([place]) 01/25/2017  . Psoriatic arthritis ([place]) 01/22/2017    Yusif Gnau Nilda Simmer PT, MPH  05/03/2020, 11:04 AM  [facility] [place], [place], [address] Phone: [phone]   Fax:  [phone]  Name: Alandria Butkiewicz MRN: [id number] Date of Birth: Apr 30, 1966  PHYSICAL THERAPY DISCHARGE SUMMARY  Visits from Start of Care: 2  Current functional level related to goals / functional outcomes: See progress note for discharge status    Remaining deficits: Unknown    Education / Equipment: HEP   Plan: Patient agrees to discharge.  Patient goals were partially met. Patient is being discharged due to not returning since the last visit.  ?????     Hanad Leino P. Helene Kelp PT, MPH 06/26/20 12:28 PM

## 2020-05-06 ENCOUNTER — Telehealth: Payer: Self-pay | Admitting: Physician Assistant

## 2020-05-06 NOTE — Telephone Encounter (Signed)
I will work her in. Ok to take a space even if more than 10.

## 2020-05-06 NOTE — Telephone Encounter (Signed)
Appointment has been made. No further questions at this time.  

## 2020-05-06 NOTE — Telephone Encounter (Signed)
Patient wanted to know if she could have a follow up visit before June 1st. States that she did not want to see anyone else because PCP was already in the middle of her paperwork. States that she wanted to know if she would be worked in. Did advise patient next appointment is 6/7.

## 2020-05-07 ENCOUNTER — Encounter: Payer: Self-pay | Admitting: Physician Assistant

## 2020-05-07 ENCOUNTER — Ambulatory Visit (INDEPENDENT_AMBULATORY_CARE_PROVIDER_SITE_OTHER): Payer: BC Managed Care – PPO | Admitting: Physician Assistant

## 2020-05-07 VITALS — BP 152/78 | HR 106 | Ht 64.0 in | Wt 279.0 lb

## 2020-05-07 DIAGNOSIS — M5441 Lumbago with sciatica, right side: Secondary | ICD-10-CM

## 2020-05-07 DIAGNOSIS — G8929 Other chronic pain: Secondary | ICD-10-CM

## 2020-05-07 DIAGNOSIS — M25471 Effusion, right ankle: Secondary | ICD-10-CM | POA: Diagnosis not present

## 2020-05-07 DIAGNOSIS — K2900 Acute gastritis without bleeding: Secondary | ICD-10-CM

## 2020-05-07 DIAGNOSIS — R29898 Other symptoms and signs involving the musculoskeletal system: Secondary | ICD-10-CM | POA: Diagnosis not present

## 2020-05-07 DIAGNOSIS — M7989 Other specified soft tissue disorders: Secondary | ICD-10-CM | POA: Insufficient documentation

## 2020-05-07 DIAGNOSIS — L4050 Arthropathic psoriasis, unspecified: Secondary | ICD-10-CM

## 2020-05-07 MED ORDER — HYDROCHLOROTHIAZIDE 12.5 MG PO TABS: 12.5000 mg | ORAL_TABLET | Freq: Every day | ORAL | 3 refills | Status: DC

## 2020-05-07 MED ORDER — OMEPRAZOLE 40 MG PO CPDR: 40.0000 mg | DELAYED_RELEASE_CAPSULE | Freq: Every day | ORAL | 2 refills | Status: DC

## 2020-05-07 NOTE — Progress Notes (Signed)
Subjective:    Patient ID: Sharon Tyler, female    DOB: 1966/09/21, 54 y.o.   MRN: [id number]  HPI  Pt is a 54 yo obese female with psoratic arthritis, chronic right sided low back pain with sciatica, right leg weakness and swelling who presents to the clinic for follow up/ she has been written out of work until 05/14/2020 due to the physical nature of the job. She is going to PT regularly. It does help but not persistencly working. MRI done a few months ago. She is taking muscle relaxers and ibuprofen 800mg  for pain. She has pain with any bending, lifting, twisting, pulling and makes it impossible to work at as a [employer].   She is having some upper abdominal discomfort and stools have changed to a clay color. Pt denies any melena or hematochezia. Denies diarrhea or constipation.    .. Active Ambulatory Problems    Diagnosis Date Noted  . Psoriatic arthritis (HCC) 01/22/2017  . Morbid obesity (HCC) 01/25/2017  . Varicose veins of lower extremity 06/30/2019  . B12 deficiency 07/03/2019  . Vitamin deficiency 07/03/2019  . Chronic right-sided low back pain with right-sided sciatica 11/20/2019  . Facet arthritis of lumbar region 11/21/2019  . DDD (degenerative disc disease), lumbar 11/21/2019  . Muscle spasm 11/21/2019  . Right leg weakness 11/21/2019  . Other intervertebral disc degeneration, lumbar region 11/21/2019  . Panic attack 03/13/2020  . Right leg swelling 05/07/2020   Resolved Ambulatory Problems    Diagnosis Date Noted  . Spinal stenosis of lumbosacral region 11/20/2019   No Additional Past Medical History    Review of Systems See HPI.     Objective:   Physical Exam Vitals reviewed.  Constitutional:      Appearance: Normal appearance. She is obese.  HENT:     Head: Normocephalic.  Neck:     Vascular: No carotid bruit.  Cardiovascular:     Rate and Rhythm: Normal rate and regular rhythm.     Pulses: Normal pulses.  Pulmonary:     Effort: Pulmonary  effort is normal.     Breath sounds: Normal breath sounds.  Abdominal:     General: Bowel sounds are normal. There is no distension.     Palpations: Abdomen is soft.     Tenderness: There is no abdominal tenderness. There is no right CVA tenderness, left CVA tenderness, guarding or rebound.     Comments: Epigastric tenderness to palpation.   Musculoskeletal:     Right lower leg: Edema present.     Comments: Non pitting edema feet and ankles on right side.   Neurological:     General: No focal deficit present.     Mental Status: She is alert and oriented to person, place, and time.  Psychiatric:        Mood and Affect: Mood normal.           Assessment & Plan:  14/07/2020Marland KitchenJenette was seen today for follow-up.  Diagnoses and all orders for this visit:  Chronic right-sided low back pain with right-sided sciatica -     Ambulatory referral to Orthopedic Surgery  Acute gastritis without hemorrhage, unspecified gastritis type -     omeprazole (PRILOSEC) 40 MG capsule; Take 1 capsule (40 mg total) by mouth daily.  Right leg weakness -     Ambulatory referral to Orthopedic Surgery  Right ankle swelling -     hydrochlorothiazide (HYDRODIURIL) 12.5 MG tablet; Take 1 tablet (12.5 mg total) by mouth daily.  Psoriatic arthritis ([place])  Morbid obesity (HCC)   Swelling in right ankle and leg likely venous stasis and on the side where she has more weakness and pain.  Use compression stockings and keep legs elevated.  Start HCTZ daily. This should also help with elevated BP today.  Rheumatology will order CMP to check kidney function.   Suspect some gastritis. Added omeprazole daily. Discussed GERD diet.  Take ibuprofen with food.  Pt's mother likely has lung cancer and she is under a lot of stress.   Written out for another 3 months of work. PT helps but still in significant pain. Needs referral to orthopedist.

## 2020-05-07 NOTE — Patient Instructions (Addendum)
Refer you to [facility] specialist.  Start HCTZ daily for swelling.  Get in with rheumatology.   Gastritis, Adult  Gastritis is swelling (inflammation) of the stomach. Gastritis can develop quickly (acute). It can also develop slowly over time (chronic). It is important to get help for this condition. If you do not get help, your stomach can bleed, and you can get sores (ulcers) in your stomach. What are the causes? This condition may be caused by:  Germs that get to your stomach.  Drinking too much alcohol.  Medicines you are taking.  Too much acid in the stomach.  A disease of the intestines or stomach.  Stress.  An allergic reaction.  Crohn's disease.  Some cancer treatments (radiation). Sometimes the cause of this condition is not known. What are the signs or symptoms? Symptoms of this condition include:  Pain in your stomach.  A burning feeling in your stomach.  Feeling sick to your stomach (nauseous).  Throwing up (vomiting).  Feeling too full after you eat.  Weight loss.  Bad breath.  Throwing up blood.  Blood in your poop (stool). How is this diagnosed? This condition may be diagnosed with:  Your medical history and symptoms.  A physical exam.  Tests. These can include: ? Blood tests. ? Stool tests. ? A procedure to look inside your stomach (upper endoscopy). ? A test in which a sample of tissue is taken for testing (biopsy). How is this treated? Treatment for this condition depends on what caused it. You may be given:  Antibiotic medicine, if your condition was caused by germs.  H2 blockers and similar medicines, if your condition was caused by too much acid. Follow these instructions at home: Medicines  Take over-the-counter and prescription medicines only as told by your doctor.  If you were prescribed an antibiotic medicine, take it as told by your doctor. Do not stop taking it even if you start to feel better. Eating  and drinking   Eat small meals often, instead of large meals.  Avoid foods and drinks that make your symptoms worse.  Drink enough fluid to keep your pee (urine) pale yellow. Alcohol use  Do not drink alcohol if: ? Your doctor tells you not to drink. ? You are pregnant, may be pregnant, or are planning to become pregnant.  If you drink alcohol: ? Limit your use to:  0-1 drink a day for women.  0-2 drinks a day for men. ? Be aware of how much alcohol is in your drink. In the [place] one drink equals one 12 oz bottle of beer (355 mL), one 5 oz glass of wine (148 mL), or one 1 oz glass of hard liquor (44 mL). General instructions  Talk with your doctor about ways to manage stress. You can exercise or do deep breathing, meditation, or yoga.  Do not smoke or use products that have nicotine or tobacco. If you need help quitting, ask your doctor.  Keep all follow-up visits as told by your doctor. This is important. Contact a doctor if:  Your symptoms get worse.  Your symptoms go away and then come back. Get help right away if:  You throw up blood or something that looks like coffee grounds.  You have black or dark red poop.  You throw up any time you try to drink fluids.  Your stomach pain gets worse.  You have a fever.  You do not feel better after one week. Summary  Gastritis is swelling (  inflammation) of the stomach.  You must get help for this condition. If you do not get help, your stomach can bleed, and you can get sores (ulcers).  This condition is diagnosed with medical history, physical exam, or tests.  You can be treated with medicines for germs or medicines to block too much acid in your stomach. This information is not intended to replace advice given to you by your health care provider. Make sure you discuss any questions you have with your health care provider. Document Revised: 04/19/2018 Document Reviewed: 04/19/2018 Elsevier Patient Education  2020  [employer].

## 2020-05-09 ENCOUNTER — Telehealth: Payer: Self-pay | Admitting: Neurology

## 2020-05-09 ENCOUNTER — Encounter: Payer: BC Managed Care – PPO | Admitting: Physical Therapy

## 2020-05-09 NOTE — Telephone Encounter (Signed)
Note from 05/07/2020 faxed to [employer] per patient request to [phone] with confirmation received.

## 2020-06-29 LAB — COLOGUARD

## 2020-07-04 ENCOUNTER — Telehealth: Payer: Self-pay | Admitting: Neurology

## 2020-07-04 NOTE — Telephone Encounter (Signed)
Received note from [employer] that Cologuard order expired. Reached out to patient and she does not want new order at this time.   Did want me to let Sharon Tyler know that her mother is in treatment right now and doing about the same. [name] - FYI.

## 2020-08-06 ENCOUNTER — Ambulatory Visit (INDEPENDENT_AMBULATORY_CARE_PROVIDER_SITE_OTHER): Payer: BC Managed Care – PPO | Admitting: Physician Assistant

## 2020-08-06 ENCOUNTER — Encounter: Payer: Self-pay | Admitting: Physician Assistant

## 2020-08-06 VITALS — BP 143/91 | HR 97 | Temp 98.3°F | Wt 282.0 lb

## 2020-08-06 DIAGNOSIS — L4050 Arthropathic psoriasis, unspecified: Secondary | ICD-10-CM

## 2020-08-06 DIAGNOSIS — M5441 Lumbago with sciatica, right side: Secondary | ICD-10-CM

## 2020-08-06 DIAGNOSIS — I10 Essential (primary) hypertension: Secondary | ICD-10-CM

## 2020-08-06 DIAGNOSIS — G8929 Other chronic pain: Secondary | ICD-10-CM

## 2020-08-06 DIAGNOSIS — M5136 Other intervertebral disc degeneration, lumbar region: Secondary | ICD-10-CM | POA: Diagnosis not present

## 2020-08-06 DIAGNOSIS — M47816 Spondylosis without myelopathy or radiculopathy, lumbar region: Secondary | ICD-10-CM | POA: Diagnosis not present

## 2020-08-06 MED ORDER — DULOXETINE HCL 30 MG PO CPEP: 30.0000 mg | ORAL_CAPSULE | Freq: Every day | ORAL | 2 refills | Status: DC

## 2020-08-06 NOTE — Progress Notes (Signed)
Subjective:    Patient ID: Sharon Tyler, female    DOB: 1966-02-17, 54 y.o.   MRN: [id number]  HPI  Pt is a 54 yo obese female with psoriatic arthritis, lumbar degenerative disc disease, chronic right-sided low back pain, facet degeneration who presents to the clinic for 8-month follow-up.  She sees rheumatology for psoriatic arthritis.  She is in support of her filing for disability. Pt has tremendous hand dexterity issues.   Patient did see orthopedic at [facility] and they suggested bursa and SI joint infections. Per Dr. Karie Schwalbe last note he wanted to do SI joint injections as well. She continues to have disabling pain. She cannot afford PT any more but it did help. She is not able to stand for more than at a time. She has problems with any bending, lifting, twisting, pulling, pushing.   Pt is not taking HCTZ. Denies any CP, palpitations, headaches or vision changes.   Pt is very upset about her weight. She is eating clean. She is IF 16 to 8. She is trying to move more. She has gained weight.     .. Active Ambulatory Problems    Diagnosis Date Noted  . Psoriatic arthritis (HCC) 01/22/2017  . Morbid obesity (HCC) 01/25/2017  . Varicose veins of lower extremity 06/30/2019  . B12 deficiency 07/03/2019  . Vitamin deficiency 07/03/2019  . Chronic right-sided low back pain with right-sided sciatica 11/20/2019  . Facet arthritis of lumbar region 11/21/2019  . DDD (degenerative disc disease), lumbar 11/21/2019  . Muscle spasm 11/21/2019  . Right leg weakness 11/21/2019  . Other intervertebral disc degeneration, lumbar region 11/21/2019  . Panic attack 03/13/2020  . Right leg swelling 05/07/2020  . Essential hypertension 08/15/2020   Resolved Ambulatory Problems    Diagnosis Date Noted  . Spinal stenosis of lumbosacral region 11/20/2019   No Additional Past Medical History       Review of Systems See HPI.     Objective:   Physical Exam Vitals reviewed.   Constitutional:      Appearance: Normal appearance. She is obese.  Cardiovascular:     Rate and Rhythm: Normal rate and regular rhythm.     Pulses: Normal pulses.  Pulmonary:     Effort: Pulmonary effort is normal.  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     Neurological:     General: No focal deficit present.     Mental Status: She is alert and oriented to person, place, and time.  Psychiatric:        Mood and Affect: Mood normal.           Assessment & Plan:  [name] was seen today for follow-up.  Diagnoses and all orders for this visit:  Chronic right-sided low back pain with right-sided sciatica -     DULoxetine (CYMBALTA) 30 MG capsule; Take 1 capsule (30 mg total) by mouth daily.  DDD (degenerative disc disease), lumbar -     DULoxetine (CYMBALTA) 30 MG capsule; Take 1 capsule (30 mg total) by mouth daily.  Facet arthritis of lumbar region -     DULoxetine (CYMBALTA) 30 MG capsule; Take 1 capsule (30 mg total) by mouth daily.  Psoriatic arthritis (HCC)  Morbid obesity (HCC)  Essential hypertension   BP elevated. Need to restart HCTZ. Discussed low salt diet.   Keep eating healthy and moving hopefully weight will follow. Discussed medications. Pt opts to  not proceed with medications. She has a lot of stress with her sick mother right now.   Marland Kitchen.Discussed low carb diet with 1500 calories and 80g of protein.  Exercising at least 150 minutes a week.  [employer] could be a Chief Technology Officer.    Continue follow up with rheumatology for psoriatic arthritis.   Discussed chronic low back pain. Suggest follow up with ortho or T and consider getting the injections.  She agreed to try cymbalta. Discussed side effects.  Continue stretches for low back at home.  Consider functional vital capacity exam for disability after trial of injections.   Pt is currently not able to work. Wrote note for another  3 months.   Spent 35 minutes with patient discussing past visits, plans, medications, injections.

## 2020-08-06 NOTE — Patient Instructions (Addendum)
Get in with Dr. Karie Schwalbe for injections.  cymbalta daily.

## 2020-08-07 ENCOUNTER — Ambulatory Visit: Payer: BC Managed Care – PPO | Admitting: Physician Assistant

## 2020-08-15 DIAGNOSIS — I10 Essential (primary) hypertension: Secondary | ICD-10-CM | POA: Insufficient documentation

## 2020-08-30 ENCOUNTER — Telehealth: Payer: Self-pay | Admitting: Neurology

## 2020-08-30 NOTE — Telephone Encounter (Signed)
Patient left vm at 3:30 pm stating she thinks she has a bladder infection and wants antibiotics.

## 2020-08-30 NOTE — Telephone Encounter (Signed)
Spoke with patient. Made her aware we would need a visit. Scheduled for Monday. She has taken AZO today. If gets worse over the weekend she will go to urgent care.

## 2020-09-02 ENCOUNTER — Other Ambulatory Visit: Payer: Self-pay

## 2020-09-02 ENCOUNTER — Encounter: Payer: Self-pay | Admitting: Physician Assistant

## 2020-09-02 ENCOUNTER — Ambulatory Visit (INDEPENDENT_AMBULATORY_CARE_PROVIDER_SITE_OTHER): Payer: BC Managed Care – PPO | Admitting: Physician Assistant

## 2020-09-02 VITALS — BP 153/70 | HR 108 | Temp 99.1°F | Ht 64.0 in | Wt 278.0 lb

## 2020-09-02 DIAGNOSIS — N3001 Acute cystitis with hematuria: Secondary | ICD-10-CM | POA: Diagnosis not present

## 2020-09-02 DIAGNOSIS — F4321 Adjustment disorder with depressed mood: Secondary | ICD-10-CM | POA: Diagnosis not present

## 2020-09-02 DIAGNOSIS — F4320 Adjustment disorder, unspecified: Secondary | ICD-10-CM | POA: Insufficient documentation

## 2020-09-02 DIAGNOSIS — R300 Dysuria: Secondary | ICD-10-CM

## 2020-09-02 LAB — POCT URINALYSIS DIP (CLINITEK)
Bilirubin, UA: NEGATIVE
Glucose, UA: NEGATIVE mg/dL
Ketones, POC UA: NEGATIVE mg/dL
Nitrite, UA: NEGATIVE
POC PROTEIN,UA: 30 — AB
Spec Grav, UA: 1.015 (ref 1.010–1.025)
Urobilinogen, UA: 0.2 E.U./dL
pH, UA: 6 (ref 5.0–8.0)

## 2020-09-02 MED ORDER — NITROFURANTOIN MONOHYD MACRO 100 MG PO CAPS: 100.0000 mg | ORAL_CAPSULE | Freq: Two times a day (BID) | ORAL | 0 refills | Status: DC

## 2020-09-02 NOTE — Progress Notes (Signed)
Subjective:    Patient ID: Sharon Tyler, female    DOB: Nov 23, 1966, 54 y.o.   MRN: [id number]  HPI  Patient is a 54 year old female with dysuria for the last 4 days.  She has not tried anything to make better.  She is running a low-grade fever.  She feels completely worn out.  She has been in the hospital with her dying mother.  Her mother passed away 24 hours ago.  She is completely exhausted.  She denies any nausea, flank pain.  She just wants something for her urinary tract infection.  She is very upset today.  She feels like they did not take very good care of her mother in the hospital.  She feels like maybe she died because she was not as active in her care.  .. Active Ambulatory Problems    Diagnosis Date Noted  . Psoriatic arthritis (HCC) 01/22/2017  . Morbid obesity (HCC) 01/25/2017  . Varicose veins of lower extremity 06/30/2019  . B12 deficiency 07/03/2019  . Vitamin deficiency 07/03/2019  . Chronic right-sided low back pain with right-sided sciatica 11/20/2019  . Facet arthritis of lumbar region 11/21/2019  . DDD (degenerative disc disease), lumbar 11/21/2019  . Muscle spasm 11/21/2019  . Right leg weakness 11/21/2019  . Other intervertebral disc degeneration, lumbar region 11/21/2019  . Panic attack 03/13/2020  . Right leg swelling 05/07/2020  . Essential hypertension 08/15/2020  . Grief reaction 09/02/2020   Resolved Ambulatory Problems    Diagnosis Date Noted  . Spinal stenosis of lumbosacral region 11/20/2019   No Additional Past Medical History       Review of Systems See HPI.     Objective:   Physical Exam Vitals reviewed.  Constitutional:      Appearance: Normal appearance. She is obese.  HENT:     Head: Normocephalic.  Cardiovascular:     Rate and Rhythm: Normal rate and regular rhythm.     Pulses: Normal pulses.  Pulmonary:     Effort: Pulmonary effort is normal.     Breath sounds: Normal breath sounds.  Abdominal:     General: There is no  distension.     Palpations: Abdomen is soft.     Tenderness: There is no abdominal tenderness. There is no right CVA tenderness, left CVA tenderness, guarding or rebound.  Neurological:     General: No focal deficit present.     Mental Status: She is alert and oriented to person, place, and time.  Psychiatric:     Comments: Emotional and tearful.            Assessment & Plan:  Marland KitchenMarland KitchenDiagnoses and all orders for this visit:  Acute cystitis with hematuria -     nitrofurantoin, macrocrystal-monohydrate, (MACROBID) 100 MG capsule; Take 1 capsule (100 mg total) by mouth 2 (two) times daily.  Dysuria -     POCT URINALYSIS DIP (CLINITEK) -     Urine Culture -     nitrofurantoin, macrocrystal-monohydrate, (MACROBID) 100 MG capsule; Take 1 capsule (100 mg total) by mouth 2 (two) times daily.  Grief reaction   .Marland Kitchen Results for orders placed or performed in visit on 09/02/20  POCT URINALYSIS DIP (CLINITEK)  Result Value Ref Range   Color, UA yellow yellow   Clarity, UA clear clear   Glucose, UA negative negative mg/dL   Bilirubin, UA negative negative   Ketones, POC UA negative negative mg/dL   Spec Grav, UA 7.253 6.644 - 1.025   Blood, UA moderate (  A) negative   pH, UA 6.0 5.0 - 8.0   POC PROTEIN,UA =30 (A) negative, trace   Urobilinogen, UA 0.2 0.2 or 1.0 E.U./dL   Nitrite, UA Negative Negative   Leukocytes, UA Small (1+) (A) Negative   Will culture.  UA was positive for leukocytes and blood.  With her symptoms I will treat empirically with Macrobid.  Encourage increasing hydration.  Handout given.  Follow-up if symptoms worsen or do not improve.  Discussed patient's grief today.  Discussed her guilt that she could have done something.  I reassured her that I knew her mother that she was aging and there were many things wrong with her.  Patient declined any medications to help her get through this difficult time.  Patient denied any counseling to help her get through this time.   Strongly urged her to follow-up if she is having trouble sleeping or processing this grief.  Discussed there is a hospice program to help grieving family members.  Spent 30 minutes with patient discussing treatment plan.

## 2020-09-02 NOTE — Patient Instructions (Signed)
Urinary Tract Infection, Adult A urinary tract infection (UTI) is an infection of any part of the urinary tract. The urinary tract includes:  The kidneys.  The ureters.  The bladder.  The urethra. These organs make, store, and get rid of pee (urine) in the body. What are the causes? This is caused by germs (bacteria) in your genital area. These germs grow and cause swelling (inflammation) of your urinary tract. What increases the risk? You are more likely to develop this condition if:  You have a small, thin tube (catheter) to drain pee.  You cannot control when you pee or poop (incontinence).  You are female, and: ? You use these methods to prevent pregnancy:  A medicine that kills sperm (spermicide).  A device that blocks sperm (diaphragm). ? You have low levels of a female hormone (estrogen). ? You are pregnant.  You have genes that add to your risk.  You are sexually active.  You take antibiotic medicines.  You have trouble peeing because of: ? A prostate that is bigger than normal, if you are female. ? A blockage in the part of your body that drains pee from the bladder (urethra). ? A kidney stone. ? A nerve condition that affects your bladder (neurogenic bladder). ? Not getting enough to drink. ? Not peeing often enough.  You have other conditions, such as: ? Diabetes. ? A weak disease-fighting system (immune system). ? Sickle cell disease. ? Gout. ? Injury of the spine. What are the signs or symptoms? Symptoms of this condition include:  Needing to pee right away (urgently).  Peeing often.  Peeing small amounts often.  Pain or burning when peeing.  Blood in the pee.  Pee that smells bad or not like normal.  Trouble peeing.  Pee that is cloudy.  Fluid coming from the vagina, if you are female.  Pain in the belly or lower back. Other symptoms include:  Throwing up (vomiting).  No urge to eat.  Feeling mixed up (confused).  Being tired  and grouchy (irritable).  A fever.  Watery poop (diarrhea). How is this treated? This condition may be treated with:  Antibiotic medicine.  Other medicines.  Drinking enough water. Follow these instructions at home:  Medicines  Take over-the-counter and prescription medicines only as told by your doctor.  If you were prescribed an antibiotic medicine, take it as told by your doctor. Do not stop taking it even if you start to feel better. General instructions  Make sure you: ? Pee until your bladder is empty. ? Do not hold pee for a long time. ? Empty your bladder after sex. ? Wipe from front to back after pooping if you are a female. Use each tissue one time when you wipe.  Drink enough fluid to keep your pee pale yellow.  Keep all follow-up visits as told by your doctor. This is important. Contact a doctor if:  You do not get better after 1-2 days.  Your symptoms go away and then come back. Get help right away if:  You have very bad back pain.  You have very bad pain in your lower belly.  You have a fever.  You are sick to your stomach (nauseous).  You are throwing up. Summary  A urinary tract infection (UTI) is an infection of any part of the urinary tract.  This condition is caused by germs in your genital area.  There are many risk factors for a UTI. These include having a small, thin   tube to drain pee and not being able to control when you pee or poop.  Treatment includes antibiotic medicines for germs.  Drink enough fluid to keep your pee pale yellow. This information is not intended to replace advice given to you by your health care provider. Make sure you discuss any questions you have with your health care provider. Document Revised: 11/17/2018 Document Reviewed: 06/09/2018 Elsevier Patient Education  2020 Elsevier Inc.  

## 2020-09-05 LAB — URINE CULTURE
MICRO NUMBER:: 10972175
SPECIMEN QUALITY:: ADEQUATE

## 2020-09-05 NOTE — Progress Notes (Signed)
E.coli on urine culture. Macrobid should treat. Let us know if not getting better.

## 2020-09-06 ENCOUNTER — Telehealth: Payer: Self-pay | Admitting: Neurology

## 2020-09-06 NOTE — Telephone Encounter (Signed)
[employer] disability forms completed and faxed to [phone] with confirmation received.

## 2020-11-05 ENCOUNTER — Ambulatory Visit (INDEPENDENT_AMBULATORY_CARE_PROVIDER_SITE_OTHER): Payer: BC Managed Care – PPO | Admitting: Physician Assistant

## 2020-11-05 ENCOUNTER — Other Ambulatory Visit: Payer: Self-pay

## 2020-11-05 VITALS — BP 147/70 | HR 102 | Ht 64.0 in | Wt 277.0 lb

## 2020-11-05 DIAGNOSIS — L4050 Arthropathic psoriasis, unspecified: Secondary | ICD-10-CM

## 2020-11-05 DIAGNOSIS — M5136 Other intervertebral disc degeneration, lumbar region: Secondary | ICD-10-CM

## 2020-11-05 DIAGNOSIS — G8929 Other chronic pain: Secondary | ICD-10-CM

## 2020-11-05 DIAGNOSIS — Z13220 Encounter for screening for lipoid disorders: Secondary | ICD-10-CM

## 2020-11-05 DIAGNOSIS — E559 Vitamin D deficiency, unspecified: Secondary | ICD-10-CM

## 2020-11-05 DIAGNOSIS — Z0000 Encounter for general adult medical examination without abnormal findings: Secondary | ICD-10-CM

## 2020-11-05 DIAGNOSIS — I10 Essential (primary) hypertension: Secondary | ICD-10-CM | POA: Diagnosis not present

## 2020-11-05 DIAGNOSIS — M47816 Spondylosis without myelopathy or radiculopathy, lumbar region: Secondary | ICD-10-CM

## 2020-11-05 DIAGNOSIS — M5441 Lumbago with sciatica, right side: Secondary | ICD-10-CM

## 2020-11-05 NOTE — Progress Notes (Addendum)
Established Patient Office Visit  Subjective:  Patient ID: Sharon Tyler, female    DOB: August 08, 1966  Age: 54 y.o. MRN: [id number]  CC:  Chief Complaint  Patient presents with  . Pain    HPI [name] presents for three month f/u appt. Patient was last seen on 08/06/2020 by Tandy Gaw. At this visit patient's BP was elevated and she was started back on HCTZ. Patient's weight was also discussed as well as chronic low back pain. Last CPE was on 06/20/2019. Patient declined mammogram, colonoscopy, shingrix, pap smear at 06/20/2019 visit and was informed of the risks. Patient had agreed to do cologuard but order expired and pt declined a new order (per 07/04/2020 telephone encounter).   She was not able to tolerate cymbalta. She hated the way it made her feel tingling and causes her to urinate more. She continues to have lots of problems with pain mostly in right buttock and running down legs. Very little standing or moving causes her to have to sit. She has to cook in 5-10 minute increments. She cannot walk for long distances. Any bending/lifting/twisting causes worsening of pain.  She has not had any injections. She is concerned due to eye doctor finding elevated pressures in eyes. She has no dx just being monitored. 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 She will continue on methotrexate.   Last MRI:  IMPRESSION: 1. Severe left facet arthrosis at L5-S1. 2. Small left foraminal disc protrusions at L2-3 and L3-4 without stenosis.     Family History  Problem Relation Age of Onset  . Cancer Mother        breast  . Cancer Father   . Hypertension Brother   . Cancer Maternal Aunt        colon     Outpatient Medications Prior to Visit  Medication Sig Dispense Refill  . Cholecalciferol (VITAMIN D PO) Take by mouth.    . cyclobenzaprine (FLEXERIL) 10 MG tablet Take 1 tablet (10 mg total) by mouth 3 (three) times  daily as needed for muscle spasms. 30 tablet 0  . folic acid (FOLVITE) 1 MG tablet Take 1 mg by mouth daily.    . hydrochlorothiazide (HYDRODIURIL) 12.5 MG tablet Take 1 tablet (12.5 mg total) by mouth daily. 30 tablet 3  . ibuprofen (ADVIL) 800 MG tablet Take 1 tablet (800 mg total) by mouth every 8 (eight) hours as needed. 90 tablet 0  . methotrexate (RHEUMATREX) 2.5 MG tablet     . omeprazole (PRILOSEC) 40 MG capsule Take 1 capsule (40 mg total) by mouth daily. 30 capsule 2  . DULoxetine (CYMBALTA) 30 MG capsule Take 1 capsule (30 mg total) by mouth daily. 30 capsule 2  . nitrofurantoin, macrocrystal-monohydrate, (MACROBID) 100 MG capsule Take 1 capsule (100 mg total) by mouth 2 (two) times daily. 14 capsule 0   No facility-administered medications prior to visit.    Allergies  Allergen Reactions  . Cymbalta [Duloxetine Hcl]     Prickly feet/palpitations/feet swollen    ROS Review of Systems See HPI.    Objective:    Physical Exam Vitals reviewed.  Constitutional:      Appearance: Normal appearance. She is obese.  Cardiovascular:     Rate and Rhythm: Normal rate and regular rhythm.     Pulses: Normal pulses.  Pulmonary:     Effort: Pulmonary effort is normal.     Breath sounds: Normal breath sounds.  Musculoskeletal:     Right lower leg: No edema.     Left lower leg: No edema.     Comments: Leaning to the left when sitting.  Strength lower ext bilaterally 5/5.  Pain with ROM of right hip.  Pain radiates into right buttocks with extension at the waist.  Twisting/bending at waist painful and ROM limited.   DIP, MCP joints of both hands with nodules and swan neck deformity occurring.   Neurological:     General: No focal deficit present.     Mental Status: She is alert and oriented to person, place, and time.  Psychiatric:     Comments: Tearful.      BP (!) 147/70   Pulse (!) 102   Ht 5\' 4"  (1.626 m)   Wt 277 lb (125.6 kg)   SpO2 99%   BMI 47.55 kg/m  Wt  Readings from Last 3 Encounters:  11/05/20 277 lb (125.6 kg)  09/02/20 278 lb (126.1 kg)  08/06/20 282 lb (127.9 kg)       Assessment & Plan:  Chronic right-sided low back pain with right-sided sciatica DDD (degenerative disc disease), lumbar Facet arthritis of lumbar region  Did not tolerate cymbalta. On ibuprofen. No response to muscle relaxer's. Not willing to try gabapentin/lyrica. Does not want pain medication. PT and dry needling helped the most. Will refer back to PT and she does agree to consider injection in office with Dr. 08/08/20. keep close follow up within 2-3 weeks of injections and see if any worsening of eye pressure.  He suggested SI injection if pain persisted. Make appt with Dr. Karie Schwalbe.  There is no way at this time patient can bend, twist, lift, or pull and complete her physically demanding job. She is written out for another 3 months. Note sent to [employer].     Psoriatic arthritis (HCC) - Continue management as per rheumatology   Morbid obesity (HCC) - Discussed low carb diet with 1500 calories and 80g of protein and exercising at least 150 minutes a week. [employer]itness Pal could be a Karie Schwalbe.  - Discussed pharmacologic weight loss options such as wegovy. She is worried about losing insurance.   Essential hypertension - Continue HCTZ and low salt diet.  -pt feels like pain is reason BP not to goal. Will continue to monitor but may need to increase if stays elevated.  -keep BP log at home.   Screening labs ordered based on need.      Follow-up:  Follow-up with rheumatology as per their instructions. Follow-up with PCP in three months.    Chief Technology OfficerMarland Kitchen PA-C, have reviewed and agree with the above documentation in it's entirety.

## 2020-11-11 ENCOUNTER — Telehealth: Payer: Self-pay | Admitting: Physician Assistant

## 2020-11-11 ENCOUNTER — Encounter: Payer: Self-pay | Admitting: Physician Assistant

## 2020-11-11 NOTE — Telephone Encounter (Signed)
Will you please fax last office note to [employer] for patient disability.

## 2020-11-11 NOTE — Telephone Encounter (Signed)
Last note faxed to [facility] at [phone] and [employer] at [phone] with confirmation received.

## 2020-11-12 ENCOUNTER — Ambulatory Visit: Payer: BC Managed Care – PPO | Admitting: Sports Medicine

## 2020-11-15 ENCOUNTER — Telehealth: Payer: Self-pay | Admitting: Neurology

## 2020-11-15 NOTE — Telephone Encounter (Signed)
[employer] forms completed and placed at the front desk for them to call patient for payment. Copy to scan/to hold. Fax cover sheet completed. Fax once payment received.

## 2020-11-18 ENCOUNTER — Ambulatory Visit (INDEPENDENT_AMBULATORY_CARE_PROVIDER_SITE_OTHER): Payer: BC Managed Care – PPO | Admitting: Sports Medicine

## 2020-11-18 DIAGNOSIS — G8929 Other chronic pain: Secondary | ICD-10-CM | POA: Diagnosis not present

## 2020-11-18 DIAGNOSIS — M5441 Lumbago with sciatica, right side: Secondary | ICD-10-CM

## 2020-11-18 MED ORDER — PREGABALIN 75 MG PO CAPS: 75.0000 mg | ORAL_CAPSULE | Freq: Two times a day (BID) | ORAL | 3 refills | Status: DC

## 2020-11-18 NOTE — Progress Notes (Signed)
    Procedures performed today:    None.  Independent interpretation of notes and tests performed by another provider:   None.  Brief History, Exam, Impression, and Recommendations:    Chronic right-sided low back pain with right-sided sciatica Sharon Tyler returns, we last saw her in February of this year, she is a pleasant 54 year old female with chronic low back pain initially localized to the right SI joint, she has been responding really well to formal physical therapy and particularly well to dry needling raising suspicion of a myofascial component of the pain. Of note the MRI did show severe left L5-S1 facet arthritis, there were no areas of obvious neuroforaminal stenosis. She is currently taking ibuprofen, we did discuss the natural history of the disease, and the lack of a cure, certainly injections be it into her SI joints or her L5-S1 facet joint could be in the future but I do think we have not fully exhausted medical therapy. Considering widespread pain along the paracervical, parathoracic, and paralumbar muscles Lyrica would be a good option. We will start 75 twice a day and do a gentle up taper.  Of note she did have some losses in her family including her mother, and is significantly grieving, I did offer some Cymbalta and grief therapy but it was declined, further management here with PCP.  We can revisit all of the above in about a month.    ___________________________________________ Sharon Tyler. Sharon Tyler, M.D., ABFM., CAQSM. Primary Care and Sports Medicine [facility]  Adjunct Instructor of Family Medicine  [employer]

## 2020-11-18 NOTE — Assessment & Plan Note (Signed)
Sharon Tyler returns, we last saw her in February of this year, she is a pleasant 54 year old female with chronic low back pain initially localized to the right SI joint, she has been responding really well to formal physical therapy and particularly well to dry needling raising suspicion of a myofascial component of the pain. Of note the MRI did show severe left L5-S1 facet arthritis, there were no areas of obvious neuroforaminal stenosis. She is currently taking ibuprofen, we did discuss the natural history of the disease, and the lack of a cure, certainly injections be it into her SI joints or her L5-S1 facet joint could be in the future but I do think we have not fully exhausted medical therapy. Considering widespread pain along the paracervical, parathoracic, and paralumbar muscles Lyrica would be a good option. We will start 75 twice a day and do a gentle up taper.  Of note she did have some losses in her family including her mother, and is significantly grieving, I did offer some Cymbalta and grief therapy but it was declined, further management here with PCP.  We can revisit all of the above in about a month.

## 2020-11-22 ENCOUNTER — Ambulatory Visit (INDEPENDENT_AMBULATORY_CARE_PROVIDER_SITE_OTHER): Payer: BC Managed Care – PPO | Admitting: Rehabilitative and Restorative Service Providers"

## 2020-11-22 ENCOUNTER — Other Ambulatory Visit: Payer: Self-pay

## 2020-11-22 DIAGNOSIS — M6281 Muscle weakness (generalized): Secondary | ICD-10-CM | POA: Diagnosis not present

## 2020-11-22 DIAGNOSIS — R293 Abnormal posture: Secondary | ICD-10-CM

## 2020-11-22 DIAGNOSIS — M62830 Muscle spasm of back: Secondary | ICD-10-CM

## 2020-11-22 DIAGNOSIS — R29898 Other symptoms and signs involving the musculoskeletal system: Secondary | ICD-10-CM

## 2020-11-22 DIAGNOSIS — M545 Low back pain, unspecified: Secondary | ICD-10-CM

## 2020-11-22 NOTE — Therapy (Addendum)
[facility] [place] [address] [place], [place], [address] Phone: [phone]   Fax:  [phone]  Physical Therapy Evaluation  Patient Details  Name: Sharon Tyler MRN: [id number] Date of Birth: March 31, 1966 Referring Provider (PT): Tandy Gaw, PA-C     Patient did not return to PT.  Please refer to initial evaluation for patient status. Thank you for the referral of this patient. Margretta Ditty, MPT   Encounter Date: 11/22/2020   PT End of Session - 11/22/20 1255    Visit Number 1    Number of Visits 6    Date for PT Re-Evaluation 01/03/21    Authorization Type BCBS    PT Start Time 1108    PT Stop Time 1150    PT Time Calculation (min) 42 min           No past medical history on file.  No past surgical history on file.  There were no vitals filed for this visit.    Subjective Assessment - 11/22/20 1116    Subjective The patient reports that pain continues in her low back and LEs.  She reports ther ex relieves it, but tightness always comes back.  Her mother passed away recently and then her Uncle died last week.  She reports she also notes worsening bladder ocntrol.  She describes urgency incontinence issues of having bladder leakage when walking to the bathroom.  She feels muscle control is more challenging.    Patient Stated Goals to get dry needling to help relieve symtpoms of tightness    Currently in Pain? Yes    Pain Score 4     Pain Location Back    Pain Orientation Right;Left;Lower    Pain Descriptors / Indicators Aching;Radiating;Discomfort    Pain Type Chronic pain    Pain Radiating Towards radiates into legs    Pain Onset More than a month ago    Pain Frequency Constant    Aggravating Factors  walking -- gets a sensation of pulling muscles    Pain Relieving Factors ther ex              [facility] PT Assessment - 11/22/20 0001      Assessment   Medical Diagnosis M51.36 (ICD-10-CM) - DDD  (degenerative disc disease), lumbar  M47.816 (ICD-10-CM) - Facet arthritis of lumbar region  M54.41,G89.29 (ICD-10-CM) - Chronic right-sided low back pain with right-sided sciatica    Referring Provider (PT) Tandy Gaw, PA-C    Onset Date/Surgical Date 11/11/20      Precautions   Precautions None      Restrictions   Weight Bearing Restrictions No      Balance Screen   Has the patient fallen in the past 6 months No    Has the patient had a decrease in activity level because of a fear of falling?  No    Is the patient reluctant to leave their home because of a fear of falling?  No      Home Environment   Living Environment Private residence    Additional Comments She notes not feeling stable descending steps due to R leg weakness      Observation/Other Assessments   Focus on Therapeutic Outcomes (FOTO)  n/a-does not wish to participate in ongoing therapy      Posture/Postural Control   Posture/Postural Control Postural limitations    Postural Limitations Rounded Shoulders;Forward head;Increased thoracic kyphosis      ROM / Strength   AROM / PROM /  Strength AROM;Strength      AROM   Overall AROM  Deficits    Overall AROM Comments tightness and discomfort with all low back pain    AROM Assessment Site Lumbar    Lumbar Flexion 60% liimitation   with tightness   Lumbar Extension 50% limitation    Lumbar - Right Side Bend 50% limitation    Lumbar - Left Side Bend 50% limitation    Lumbar - Right Rotation 50% limitation    Lumbar - Left Rotation 50% limitation      Strength   Overall Strength Deficits    Overall Strength Comments has functional strength anti-gravity; general postural weakness      Flexibility   Soft Tissue Assessment /Muscle Length yes    Hamstrings note tightness t/o low back, mid back and LEs      Palpation   Spinal mobility hypomobilty in mid thoracic spine significant    Palpation comment tenderness to palpation in paraspinal musculature, QL,and  gluteal musculature                      Objective measurements completed on examination: See above findings.       [facility] Adult PT Treatment/Exercise - 11/22/20 1311      Manual Therapy   Manual Therapy Soft tissue mobilization    Manual therapy comments skilled palpation to assess tissue response to STM and Dry needling    Soft tissue mobilization paraspinal musculature in lumbar and thoracic spine, QL, gluteus medius, and piriformis            Trigger Point Dry Needling - 11/22/20 1312    Consent Given? Yes    Education Handout Provided Yes    Muscles Treated Back/Hip Gluteus minimus;Gluteus medius;Gluteus maximus;Piriformis;Lumbar multifidi;Thoracic multifidi    Dry Needling Comments bilateral multifidi and gluteus musculature    Gluteus Minimus Response Palpable increased muscle length    Gluteus Medius Response Palpable increased muscle length    Gluteus Maximus Response Palpable increased muscle length    Piriformis Response Palpable increased muscle length    Lumbar multifidi Response Palpable increased muscle length    Thoracic multifidi response Palpable increased muscle length                PT Education - 11/22/20 1254    Education Details Reviewed/ provided handout from prior physical therapy and verbally discussed need for daily stretching and mobility to reduce tightness    Person(s) Educated Patient    Methods Explanation;Demonstration;Handout    Comprehension Verbalized understanding;Returned demonstration               PT Long Term Goals - 11/22/20 1259      PT LONG TERM GOAL #1   Title The patient will return demo HEP independently.    Time 6    Period Weeks    Target Date 01/03/21      PT LONG TERM GOAL #2   Title The patient will report pain and tightness reduced by 25%.    Time 6    Period Weeks    Target Date 01/03/21      PT LONG TERM GOAL #3   Title The patient will improve ROM lumbar spine to forward flex to  reach the floor for handling pet care and functional lifting in the home.    Time 6    Period Weeks    Target Date 01/03/21      PT LONG TERM GOAL #4  Title The patient will verbalize understanding of home walking program.    Time 6    Period Weeks    Target Date 01/03/21                  Plan - 11/22/20 1313    Clinical Impression Statement The patient is a 54 yo female presenting to OP physical therapy with chronic low back pain.  She is known to our clinic and arrives requesting dry needling alone. PT discussed need for evaluation to know what interventions are indicated.  She presents with hypomobility thoracic and lumbar spine, myofascial tightness, postural weakness, postural abnormalities, decreased strength.  PT educated patient on need for interventions beyond DN for sustainable improvement.    Personal Factors and Comorbidities Comorbidity 1    Comorbidities chronic back pain    Examination-Activity Limitations Lift;Locomotion Level;Squat;Stairs;Stand    Examination-Participation Restrictions Cleaning;Community Activity    Stability/Clinical Decision Making Stable/Uncomplicated    Clinical Decision Making Low    Rehab Potential Good    PT Frequency 1x / week    PT Duration 6 weeks    PT Treatment/Interventions ADLs/Self Care Home Management;Functional mobility training;Dry needling;Manual techniques;Therapeutic exercise;Therapeutic activities;Electrical Stimulation;Moist Heat;Cryotherapy;Taping;Patient/family education    PT Next Visit Plan STM/DN for soft tissue, focus on stretching and walking for home    PT Home Exercise Plan reviewed prior HEP from [facility]    Consulted and Agree with Plan of Care Patient           Patient will benefit from skilled therapeutic intervention in order to improve the following deficits and impairments:  Pain,Obesity,Decreased range of motion,Increased fascial restricitons,Hypomobility,Impaired flexibility,Postural  dysfunction,Decreased strength  Visit Diagnosis: Acute right-sided low back pain, unspecified whether sciatica present  Muscle weakness (generalized)  Other symptoms and signs involving the musculoskeletal system  Abnormal posture  Muscle spasm of back     Problem List Patient Active Problem List   Diagnosis Date Noted  . Grief reaction 09/02/2020  . Essential hypertension 08/15/2020  . Right leg swelling 05/07/2020  . Panic attack 03/13/2020  . Facet arthritis of lumbar region 11/21/2019  . DDD (degenerative disc disease), lumbar 11/21/2019  . Muscle spasm 11/21/2019  . Right leg weakness 11/21/2019  . Other intervertebral disc degeneration, lumbar region 11/21/2019  . Chronic right-sided low back pain with right-sided sciatica 11/20/2019  . B12 deficiency 07/03/2019  . Vitamin D deficiency 07/03/2019  . Varicose veins of lower extremity 06/30/2019  . Morbid obesity (HCC) 01/25/2017  . Psoriatic arthritis (HCC) 01/22/2017    Tonnia Bardin, PT 11/22/2020, 5:10 PM  [facility] [address] [place], [place], [address] Phone: [phone]   Fax:  [phone]  Name: Sharon Tyler MRN: [id number] Date of Birth: 03-16-1966

## 2020-11-22 NOTE — Patient Instructions (Signed)
Access Code: [email or web address] URL: [email or web address] Date: 11/22/2020 Prepared by: Margretta Ditty  Exercises Supine Lower Trunk Rotation - 2 x daily - 7 x weekly - 5 reps - 1 sets - 15 seconds hold Supine Piriformis Stretch with Foot on Ground - 2 x daily - 7 x weekly - 3 reps - 1 sets - 15 seconds hold Supine March - 2 x daily - 7 x weekly - 10 reps - 1 sets Supine Bridge - 2 x daily - 7 x weekly - 1 sets - 10 reps Cervical Retraction Prone on Elbows - 2 x daily - 7 x weekly - 10 reps - 1 sets Seated Quadratus Lumborum Stretch with Arm Overhead - 2 x daily - 7 x weekly - 3 reps - 1 sets - 20 seconds hold Seated Hamstring Stretch with Chair - 2 x daily - 7 x weekly - 3 reps - 1 sets - 30 seconds hold Shoulder Overhead Press in Flexion with Dumbbells - 2 x daily - 7 x weekly - 10 reps - 1 sets Wall Quarter Squat - 2 x daily - 7 x weekly - 10 reps - 1 sets  Patient Education Trigger Point Dry Needling

## 2020-11-23 IMAGING — DX DG LUMBAR SPINE COMPLETE 4+V
5 series · 5 of 5 positions shown · non-contrast
Comparison: None.

CLINICAL DATA: Low back pain with radiation right greater than
left.

EXAM:
LUMBAR SPINE - COMPLETE 4+ VIEW

[l-spine ap]
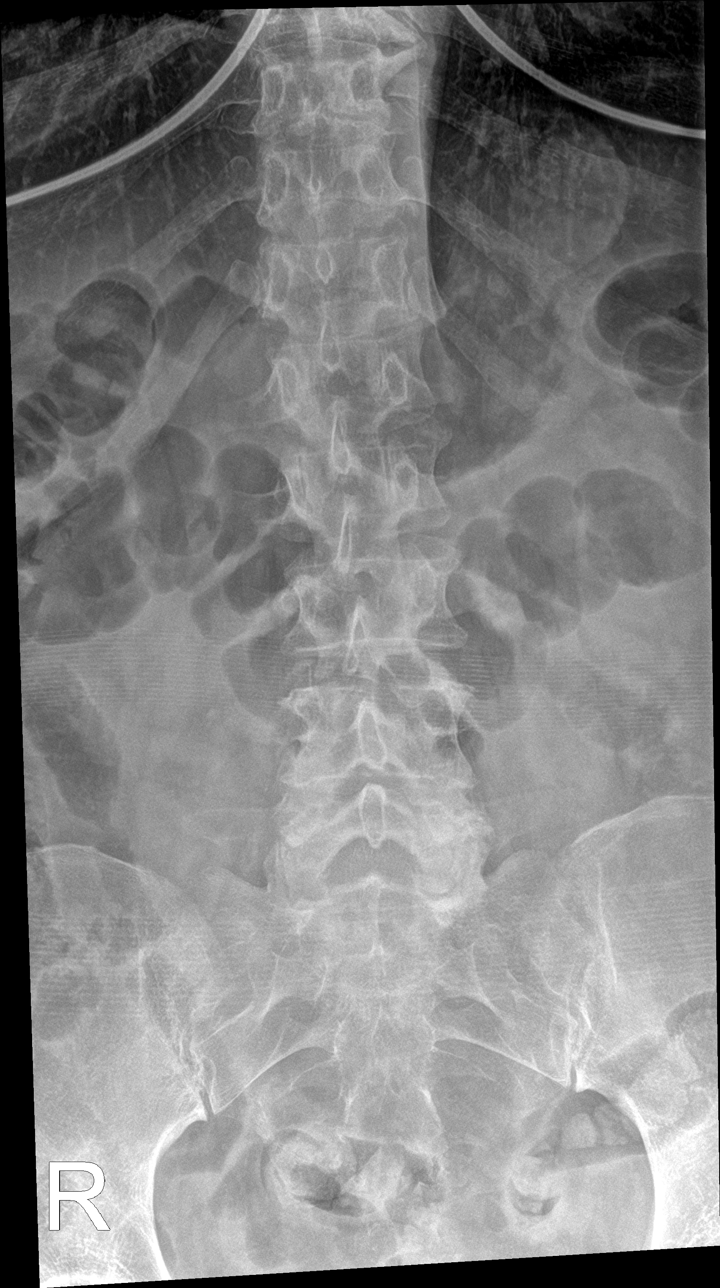

[l-spine obl (1 of 2)]
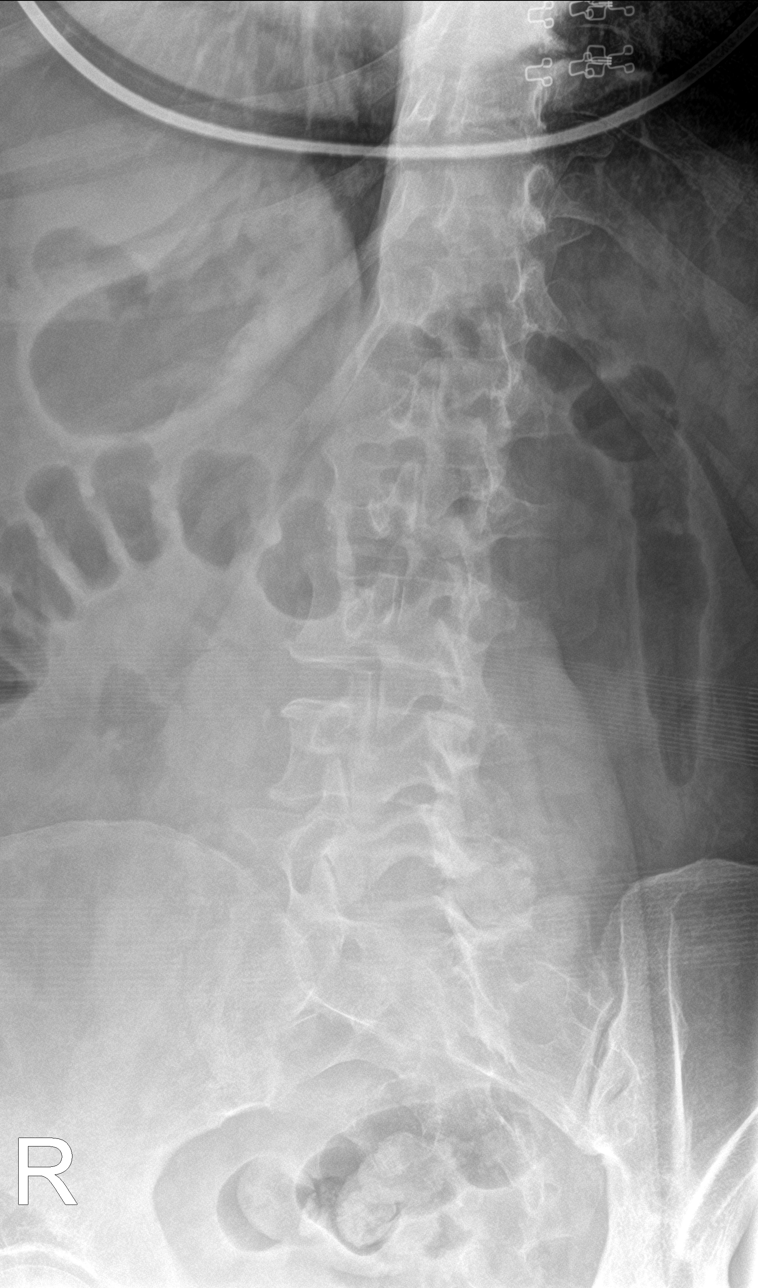

[l-spine obl (2 of 2)]
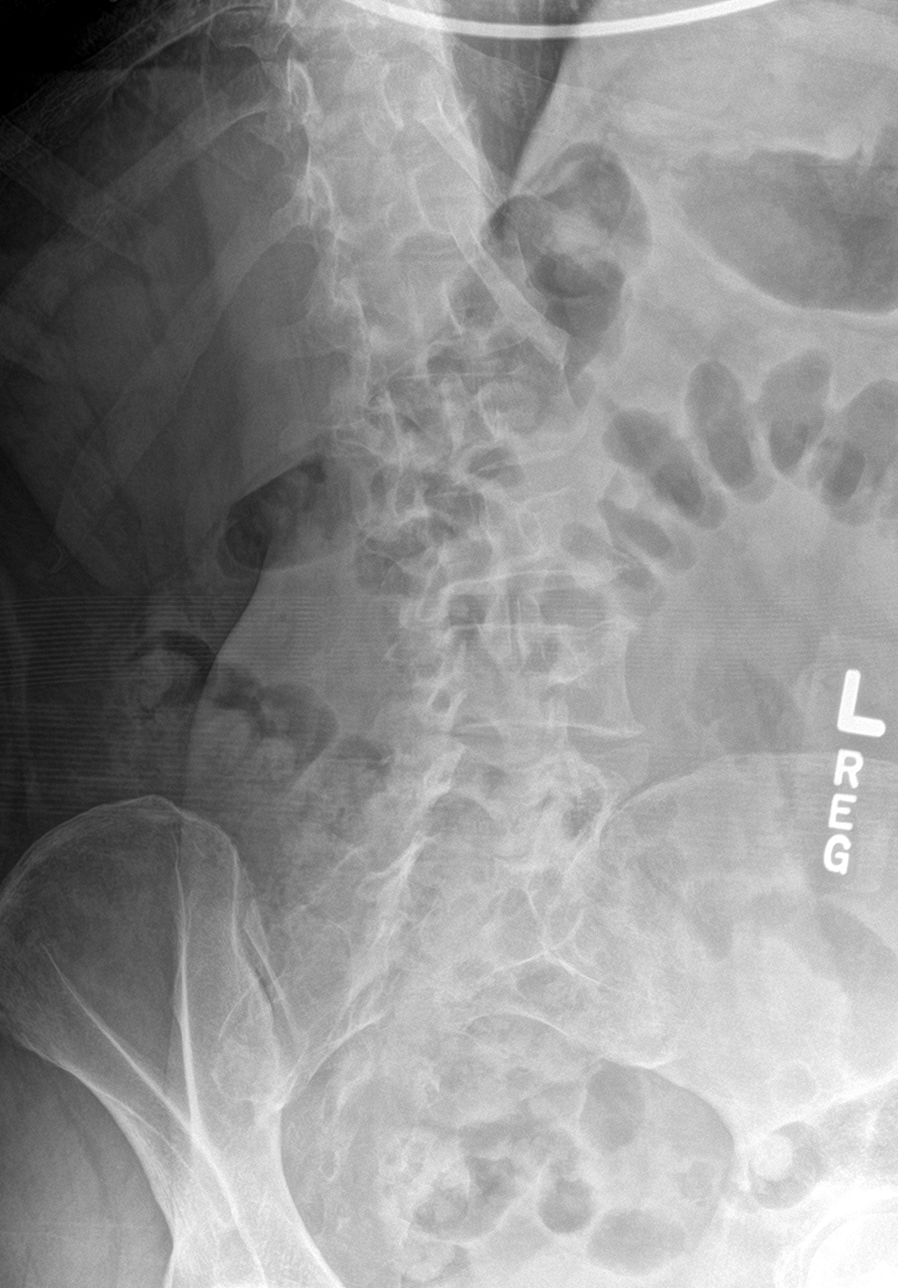

[l-spine lat]
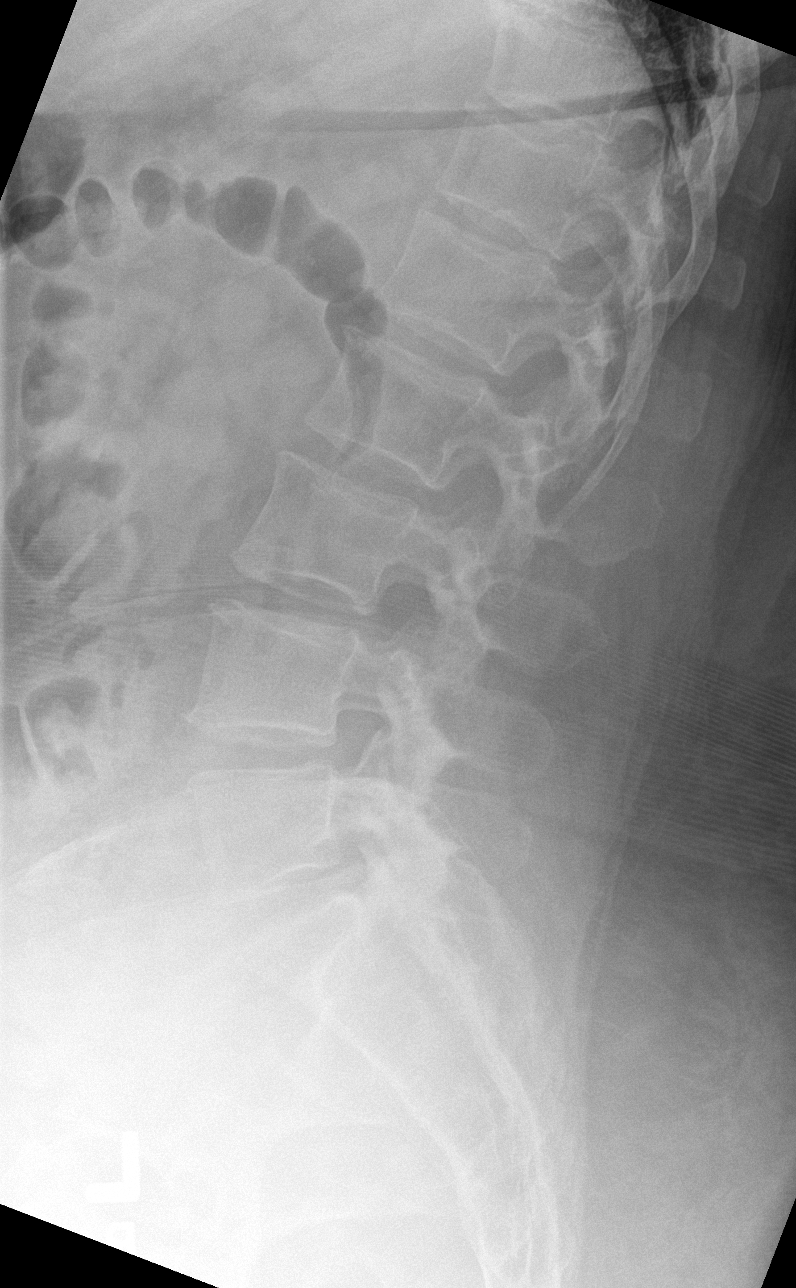

[l-spine spot]
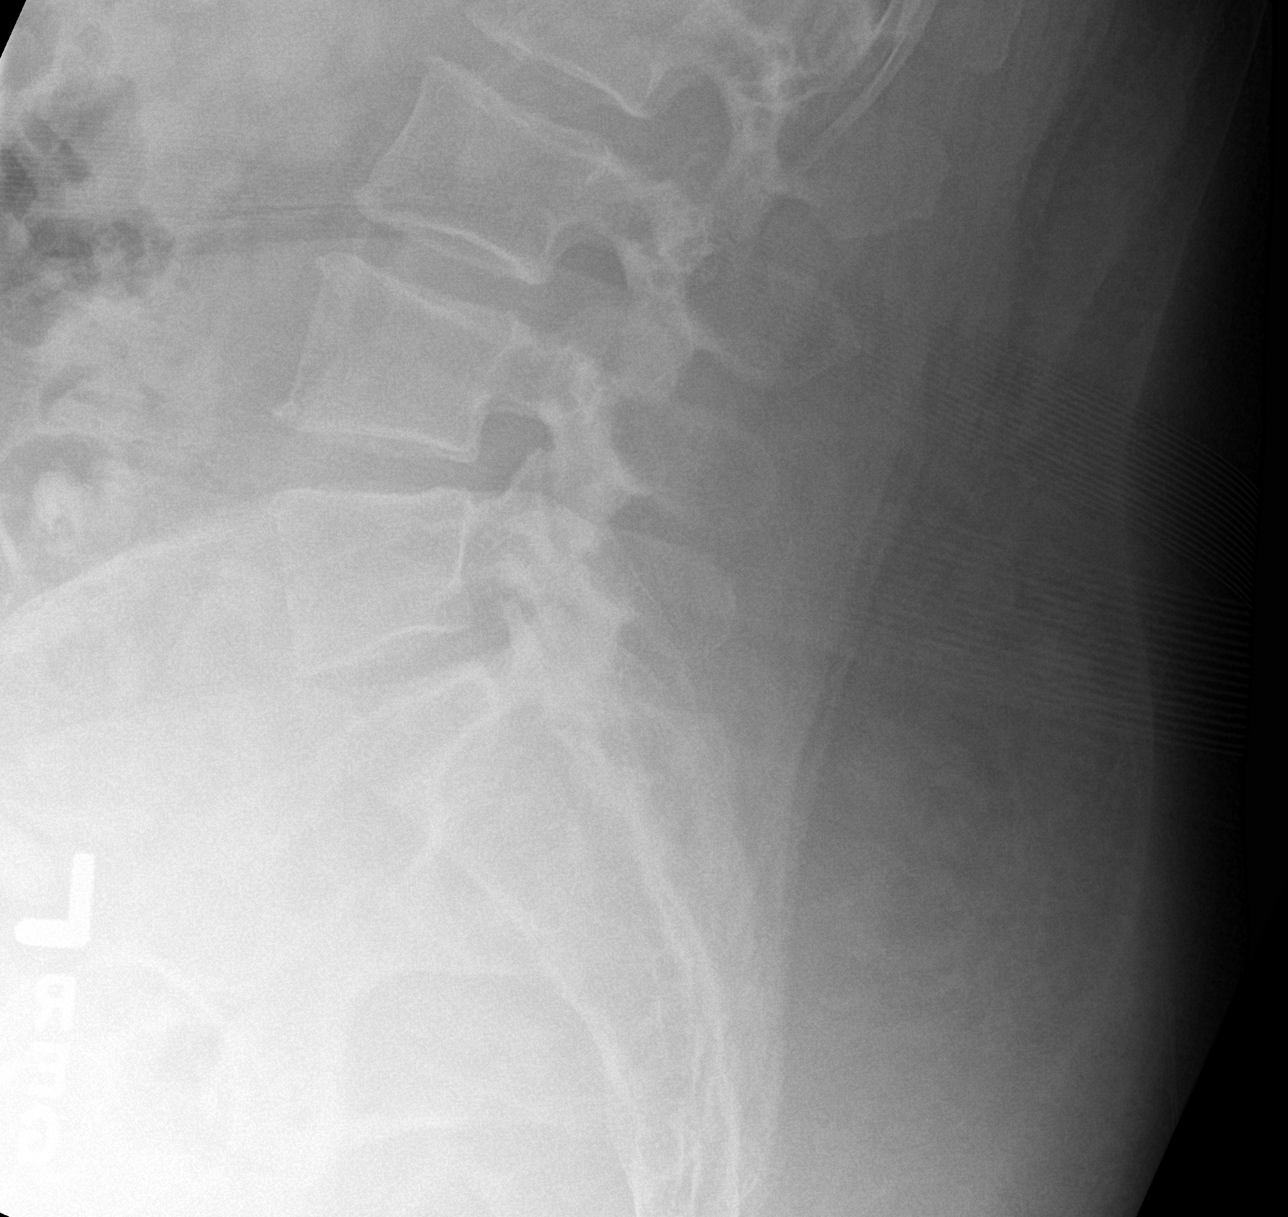

[5 of 5 positions shown; findings below may reference images not displayed]

FINDINGS: Vertebral body alignment, heights and disc space heights are normal.
There is mild spondylosis throughout the lumbar spine to include
mild facet arthropathy over the lower lumbar spine. There is no
spondylolisthesis or spondylolysis. No significant compression
fracture.
IMPRESSION: No acute findings.

Minimal spondylosis of the lumbar spine to include facet arthropathy
over the lower lumbar spine.

## 2020-12-11 ENCOUNTER — Other Ambulatory Visit: Payer: Self-pay

## 2020-12-11 ENCOUNTER — Encounter: Payer: Self-pay | Admitting: Rehabilitative and Restorative Service Providers"

## 2020-12-11 ENCOUNTER — Ambulatory Visit (INDEPENDENT_AMBULATORY_CARE_PROVIDER_SITE_OTHER): Payer: BC Managed Care – PPO | Admitting: Rehabilitative and Restorative Service Providers"

## 2020-12-11 DIAGNOSIS — M6281 Muscle weakness (generalized): Secondary | ICD-10-CM

## 2020-12-11 DIAGNOSIS — R29898 Other symptoms and signs involving the musculoskeletal system: Secondary | ICD-10-CM | POA: Diagnosis not present

## 2020-12-11 DIAGNOSIS — R293 Abnormal posture: Secondary | ICD-10-CM | POA: Diagnosis not present

## 2020-12-11 DIAGNOSIS — M545 Low back pain, unspecified: Secondary | ICD-10-CM | POA: Diagnosis not present

## 2020-12-11 DIAGNOSIS — M62830 Muscle spasm of back: Secondary | ICD-10-CM

## 2020-12-11 NOTE — Patient Instructions (Signed)
Access Code: [email or web address] URL: [email or web address] Date: 12/11/2020 Prepared by: Margretta Ditty  Exercises Supine Lower Trunk Rotation - 2 x daily - 7 x weekly - 5 reps - 1 sets - 15 seconds hold Supine Thoracic Mobilization Towel Roll Horizontal with Arm Stretch - 2 x daily - 7 x weekly - 1 sets - 1 reps - 2 minutes hold Supine Piriformis Stretch with Foot on Ground - 2 x daily - 7 x weekly - 3 reps - 1 sets - 15 seconds hold Supine March - 2 x daily - 7 x weekly - 10 reps - 1 sets Supine Bridge - 2 x daily - 7 x weekly - 1 sets - 10 reps Cervical Retraction Prone on Elbows - 2 x daily - 7 x weekly - 10 reps - 1 sets Seated Quadratus Lumborum Stretch with Arm Overhead - 2 x daily - 7 x weekly - 3 reps - 1 sets - 20 seconds hold Seated Hamstring Stretch with Chair - 2 x daily - 7 x weekly - 3 reps - 1 sets - 30 seconds hold Shoulder Overhead Press in Flexion with Dumbbells - 2 x daily - 7 x weekly - 10 reps - 1 sets Wall Quarter Squat - 2 x daily - 7 x weekly - 10 reps - 1 sets

## 2020-12-11 NOTE — Therapy (Signed)
[facility] [place] [address] [place], [place], [address] Phone: [phone]   Fax:  [phone]  Physical Therapy Treatment  Patient Details  Name: Sharon Tyler MRN: [id number] Date of Birth: November 23, 1966 Referring Provider (PT): Tandy Gaw, [place]   Encounter Date: 12/11/2020   PT End of Session - 12/11/20 1107    Visit Number 2    Number of Visits 6    Date for PT Re-Evaluation 01/03/21    Authorization Type BCBS    PT Start Time 1106    PT Stop Time 1145    PT Time Calculation (min) 39 min    Activity Tolerance Patient tolerated treatment well;Patient limited by pain    Behavior During Therapy [facility] for tasks assessed/performed           History reviewed. No pertinent past medical history.  History reviewed. No pertinent surgical history.  There were no vitals filed for this visit.   Subjective Assessment - 12/11/20 1105    Subjective The patient reports she has not been able to do HEP regularly due to cleaning out her mom's home before 2023/01/12.  Her mom passed away in 2023/09/23.    Patient Stated Goals to get dry needling to help relieve symtpoms of tightness    Currently in Pain? Yes    Pain Location Back    Pain Orientation Right;Left;Lower    Pain Descriptors / Indicators Aching;Radiating;Discomfort    Pain Radiating Towards radiates into legs    Pain Onset More than a month ago    Pain Frequency Constant    Aggravating Factors  standing, being on her feet    Pain Relieving Factors flexion to stretch              [facility] PT Assessment - 12/11/20 1109      Assessment   Medical Diagnosis M51.36 (ICD-10-CM) - DDD (degenerative disc disease), lumbar  M47.816 (ICD-10-CM) - Facet arthritis of lumbar region  M54.41,G89.29 (ICD-10-CM) - Chronic right-sided low back pain with right-sided sciatica    Referring Provider (PT) Tandy Gaw, PA-C    Onset Date/Surgical Date 11/11/20    Hand Dominance Right                          [facility] Adult PT Treatment/Exercise - 12/11/20 1109      Exercises   Exercises Lumbar;Other Exercises;Shoulder    Other Exercises  thoracic self mobilization with towel roll perpendicular to spine; wall leans with thoracic rotation x 10 reps;  walking x 250 feet discussing shoewear recommending shoe for pronation      Lumbar Exercises: Stretches   Lower Trunk Rotation 5 reps    Other Lumbar Stretch Exercise quadratus lumborum standing stretch and seated stretch x 5 repetitions      Lumbar Exercises: Supine   Other Supine Lumbar Exercises supine hip hike/depression in supine x 5 reps      Lumbar Exercises: Sidelying   Other Sidelying Lumbar Exercises hip hike/depression x 10 reps wiht passive overpressure by PT      Lumbar Exercises: Prone   Single Arm Raise Right;Left;5 reps    Single Arm Raises Limitations for QL and thoracic stretch      Shoulder Exercises: Stretch   Corner Stretch Limitations standing door frame stretch at 60 degrees      Manual Therapy   Manual Therapy Soft tissue mobilization;Joint mobilization    Manual therapy comments skilled palpation to assess tissue response  to [facility] 2 and Dry needling    Joint Mobilization prone thoracic CPA    Soft tissue mobilization paraspinal musculature thoracic and lumbar spine, QL bilaterally            Trigger Point Dry Needling - 12/11/20 1111    Consent Given? Yes    Education Handout Provided Previously provided    Muscles Treated Back/Hip Quadratus lumborum;Thoracic multifidi;Lumbar multifidi    Dry Needling Comments bilateral multifidi and R QL    Lumbar multifidi Response Palpable increased muscle length    Quadratus Lumborum Response Palpable increased muscle length    Thoracic multifidi response Palpable increased muscle length                PT Education - 12/11/20 1148    Education Details HEP progression    Person(s) Educated Patient    Methods  Explanation;Demonstration;Handout    Comprehension Verbalized understanding;Returned demonstration               PT Long Term Goals - 11/22/20 1259      PT LONG TERM GOAL #1   Title The patient will return demo HEP independently.    Time 6    Period Weeks    Target Date 01/03/21      PT LONG TERM GOAL #2   Title The patient will report pain and tightness reduced by 25%.    Time 6    Period Weeks    Target Date 01/03/21      PT LONG TERM GOAL #3   Title The patient will improve ROM lumbar spine to forward flex to reach the floor for handling pet care and functional lifting in the home.    Time 6    Period Weeks    Target Date 01/03/21      PT LONG TERM GOAL #4   Title The patient will verbalize understanding of home walking program.    Time 6    Period Weeks    Target Date 01/03/21                 Plan - 12/11/20 1143    Clinical Impression Statement The patient tolerated soft tissue and DN for QL and thoracic spine.  We also focused on mobilization for thoracic spine.  We discussed increasing compliance with home program.    PT Treatment/Interventions ADLs/Self Care Home Management;Functional mobility training;Dry needling;Manual techniques;Therapeutic exercise;Therapeutic activities;Electrical Stimulation;Moist Heat;Cryotherapy;Taping;Patient/family education    PT Next Visit Plan STM/DN for soft tissue, focus on stretching and walking for home    PT Home Exercise Plan reviewed prior HEP from [facility]    Consulted and Agree with Plan of Care Patient           Patient will benefit from skilled therapeutic intervention in order to improve the following deficits and impairments:     Visit Diagnosis: Acute right-sided low back pain, unspecified whether sciatica present  Muscle weakness (generalized)  Other symptoms and signs involving the musculoskeletal system  Abnormal posture  Muscle spasm of back     Problem List Patient Active Problem List    Diagnosis Date Noted  . Grief reaction 09/02/2020  . Essential hypertension 08/15/2020  . Right leg swelling 05/07/2020  . Panic attack 03/13/2020  . Facet arthritis of lumbar region 11/21/2019  . DDD (degenerative disc disease), lumbar 11/21/2019  . Muscle spasm 11/21/2019  . Right leg weakness 11/21/2019  . Other intervertebral disc degeneration, lumbar region 11/21/2019  . Chronic right-sided low back pain with  right-sided sciatica 11/20/2019  . B12 deficiency 07/03/2019  . Vitamin D deficiency 07/03/2019  . Varicose veins of lower extremity 06/30/2019  . Morbid obesity (HCC) 01/25/2017  . Psoriatic arthritis (HCC) 01/22/2017    Adryan Druckenmiller,PT 12/11/2020, 1:46 PM  [facility] [address] [place], [place], [address] Phone: [phone]   Fax:  [phone]  Name: Sharon Tyler MRN: [id number] Date of Birth: 1966-09-29

## 2020-12-16 ENCOUNTER — Ambulatory Visit: Payer: BC Managed Care – PPO | Admitting: Sports Medicine

## 2020-12-24 ENCOUNTER — Ambulatory Visit (INDEPENDENT_AMBULATORY_CARE_PROVIDER_SITE_OTHER): Payer: BC Managed Care – PPO | Admitting: Rehabilitative and Restorative Service Providers"

## 2020-12-24 ENCOUNTER — Other Ambulatory Visit: Payer: Self-pay

## 2020-12-24 DIAGNOSIS — M6281 Muscle weakness (generalized): Secondary | ICD-10-CM

## 2020-12-24 DIAGNOSIS — R29898 Other symptoms and signs involving the musculoskeletal system: Secondary | ICD-10-CM

## 2020-12-24 DIAGNOSIS — M545 Low back pain, unspecified: Secondary | ICD-10-CM

## 2020-12-24 NOTE — Therapy (Signed)
[facility] [place] [address] [place], [place], [address] Phone: [phone]   Fax:  [phone]  Patient Details  Name: Sharon Tyler MRN: [id number] Date of Birth: 20-Oct-1966 Referring Provider:  Jomarie Longs, PA-C  Encounter Date: 12/24/2020   ARRIVED/ NO CHARGE:   Subjective Assessment - 12/24/20 1155    Subjective The patient reports she has had a flare up of rheumatoid arthritis and saw rheumatologist last week.  She began prednisone yesterday.  The patient is doing some minimal stretching at home.  She is not participating in regular exercise due to tolerance.    Patient Stated Goals to get dry needling to help relieve symtpoms of tightness    Currently in Pain? Yes    Pain Score 8     Pain Location Generalized    Multiple Pain Sites Yes    Pain Score 7    Pain Location Back          PT and patient talked about warming up with gentle walking.  She reports she cannot participate in this due to pain today.  We discussed avoiding dry needling today due to recent RA flare up.  Patient is not sure about stretching or exercise due to pain.  PT recommended we hold treatment today and have her r/s to after she finishes steroid treatment.  If symptoms not improved, she should cancel.  Patient and PT talked about other options such as exercise in pool.  She states, "what makes you think I can get up and get to a pool when I can barely get up."  We discussed that movement is the intervention I have to offer and if she is not able to tolerate it, she can cancel.   Naijah Lacek 12/24/2020, 12:12 PM  [facility] [address] [place], [place], [address] Phone: [phone]   Fax:  [phone]

## 2021-02-04 ENCOUNTER — Ambulatory Visit (INDEPENDENT_AMBULATORY_CARE_PROVIDER_SITE_OTHER): Payer: BC Managed Care – PPO | Admitting: Physician Assistant

## 2021-02-04 ENCOUNTER — Encounter: Payer: Self-pay | Admitting: Physician Assistant

## 2021-02-04 ENCOUNTER — Other Ambulatory Visit: Payer: Self-pay

## 2021-02-04 VITALS — BP 140/78 | HR 108 | Ht 64.0 in | Wt 276.0 lb

## 2021-02-04 DIAGNOSIS — G8929 Other chronic pain: Secondary | ICD-10-CM

## 2021-02-04 DIAGNOSIS — M5441 Lumbago with sciatica, right side: Secondary | ICD-10-CM

## 2021-02-04 DIAGNOSIS — M47816 Spondylosis without myelopathy or radiculopathy, lumbar region: Secondary | ICD-10-CM | POA: Diagnosis not present

## 2021-02-04 DIAGNOSIS — L4050 Arthropathic psoriasis, unspecified: Secondary | ICD-10-CM | POA: Diagnosis not present

## 2021-02-04 DIAGNOSIS — M5136 Other intervertebral disc degeneration, lumbar region: Secondary | ICD-10-CM

## 2021-02-04 NOTE — Progress Notes (Signed)
Subjective:    Patient ID: Sharon Tyler, female    DOB: October 25, 1966, 55 y.o.   MRN: [id number]  HPI  Pt is a 55 yo obese female with HTN, lumbar DDD, facet arthritis, psoriatic arthritis and chronic pain who presents to the clinic for follow up.   She sees rheumatology for psoriatic arthritis and on methotrexate and low dose prednisone.   She recently saw Dr. Karie Schwalbe and suggested lyrica. She read side effects and not comfortable taking. Continues to go to PT for dry needling which does help a lot. She is using flexeril and ibuprofen as needed. She continues to be in pain daily. She has severe loss of function. She struggles to stand to cook and clean. She struggles with fine motor skills with her hands.    .. Active Ambulatory Problems    Diagnosis Date Noted  . Psoriatic arthritis (HCC) 01/22/2017  . Morbid obesity (HCC) 01/25/2017  . Varicose veins of lower extremity 06/30/2019  . B12 deficiency 07/03/2019  . Vitamin D deficiency 07/03/2019  . Chronic right-sided low back pain with right-sided sciatica 11/20/2019  . Facet arthritis of lumbar region 11/21/2019  . DDD (degenerative disc disease), lumbar 11/21/2019  . Muscle spasm 11/21/2019  . Right leg weakness 11/21/2019  . Other intervertebral disc degeneration, lumbar region 11/21/2019  . Panic attack 03/13/2020  . Right leg swelling 05/07/2020  . Essential hypertension 08/15/2020  . Grief reaction 09/02/2020   Resolved Ambulatory Problems    Diagnosis Date Noted  . Spinal stenosis of lumbosacral region 11/20/2019   No Additional Past Medical History    Review of Systems See HPI.     Objective:   Physical Exam Vitals reviewed.  Constitutional:      Appearance: Normal appearance. She is obese.  Cardiovascular:     Rate and Rhythm: Normal rate and regular rhythm.     Pulses: Normal pulses.     Heart sounds: Normal heart sounds.  Musculoskeletal:     Comments: Limited ROM at waist and bilateral hips.  Strength lower  ext 5/5.   Swan neck with heberden and bouchard nodes of bilateral hands/fingers.   Neurological:     General: No focal deficit present.     Mental Status: She is alert and oriented to person, place, and time.  Psychiatric:        Mood and Affect: Mood normal.           Depression screen [facility] 2/9 02/04/2021 11/20/2019 06/30/2019  Decreased Interest 0 0 0  Down, Depressed, Hopeless 0 1 0  PHQ - 2 Score 0 1 0  Altered sleeping 0 1 2  Tired, decreased energy 0 1 2  Change in appetite 0 0 0  Feeling bad or failure about yourself  0 0 0  Trouble concentrating 0 0 0  Moving slowly or fidgety/restless 0 0 0  Suicidal thoughts 0 0 0  PHQ-9 Score 0 3 4  Difficult doing work/chores Not difficult at all Somewhat difficult Somewhat difficult   .Marland Kitchen GAD 7 : Generalized Anxiety Score 02/04/2021 11/20/2019 06/30/2019  Nervous, Anxious, on Edge 3 1 0  Control/stop worrying 0 1 0  Worry too much - different things 0 1 0  Trouble relaxing 0 1 0  Restless 0 1 0  Easily annoyed or irritable 0 0 0  Afraid - awful might happen 0 1 0  Total GAD 7 Score 3 6 0  Anxiety Difficulty Not difficult at all Somewhat difficult Not difficult at all  Assessment & Plan:  Marland KitchenMarland KitchenHadassa was seen today for follow-up.  Diagnoses and all orders for this visit:  Chronic right-sided low back pain with right-sided sciatica  Psoriatic arthritis (HCC)  DDD (degenerative disc disease), lumbar  Facet arthritis of lumbar region  Morbid obesity [facility])   Medically agree with the plan that patient is unable to work. Her psoriatic arthritis has disabled her hands and her ongoing chronic low back pain prevents her from bending, lifting, twisting, pulling.   Follow up in 3 months.

## 2021-02-18 ENCOUNTER — Telehealth: Payer: Self-pay | Admitting: Neurology

## 2021-02-18 NOTE — Telephone Encounter (Addendum)
Letter and last office note faxed to claims at [phone] with confirmation received. Claim#[id number]. Tried to fax to number patient provided [phone]) but would not go through).

## 2021-05-05 ENCOUNTER — Encounter: Payer: Self-pay | Admitting: Physician Assistant

## 2021-05-05 ENCOUNTER — Ambulatory Visit (INDEPENDENT_AMBULATORY_CARE_PROVIDER_SITE_OTHER): Payer: BC Managed Care – PPO | Admitting: Physician Assistant

## 2021-05-05 ENCOUNTER — Other Ambulatory Visit: Payer: Self-pay

## 2021-05-05 VITALS — BP 121/61 | HR 100 | Ht 64.0 in | Wt 266.0 lb

## 2021-05-05 DIAGNOSIS — M79671 Pain in right foot: Secondary | ICD-10-CM

## 2021-05-05 DIAGNOSIS — L4050 Arthropathic psoriasis, unspecified: Secondary | ICD-10-CM

## 2021-05-05 DIAGNOSIS — M47816 Spondylosis without myelopathy or radiculopathy, lumbar region: Secondary | ICD-10-CM | POA: Diagnosis not present

## 2021-05-05 DIAGNOSIS — M5136 Other intervertebral disc degeneration, lumbar region: Secondary | ICD-10-CM

## 2021-05-05 DIAGNOSIS — M7741 Metatarsalgia, right foot: Secondary | ICD-10-CM

## 2021-05-05 DIAGNOSIS — M5441 Lumbago with sciatica, right side: Secondary | ICD-10-CM | POA: Diagnosis not present

## 2021-05-05 DIAGNOSIS — M25522 Pain in left elbow: Secondary | ICD-10-CM

## 2021-05-05 DIAGNOSIS — G8929 Other chronic pain: Secondary | ICD-10-CM

## 2021-05-05 MED ORDER — DICLOFENAC SODIUM 1 % EX GEL: 4.0000 g | Freq: Four times a day (QID) | CUTANEOUS | 1 refills | Status: DC

## 2021-05-05 NOTE — Progress Notes (Signed)
Subjective:    Patient ID: Sharon Tyler, female    DOB: [birth year], 55 y.o.   MRN: [id number]  HPI  Pt is a 55 yo obese female with chronic right sided low back pain, lumbar DDD, facet arthritis of lumbar region, Psoriatic arthritis who presents to the clinic for 3 month follow up. She worked for [employer] and now on long term disability. She is not able to stand more than 10 minutes or sit more than 15 minutes without having to change positions. She cannot bend, lift, twist without exacerbation of pain and loss of function and mobility. She is having more numbness and tingling radiating down legs from time to time. No bowel dysfucntion or leg weakness.   She does have some intermittent swelling of both legs. She also has some right ball of foot pain that is new and worse with ambulation. Foot pain is between 2nd and 3r metatarsal on the balls of her feet.   Left elbow pain off and on and radiates down left arm.   Last seen by Dr. Karie Schwalbe in 11/2020 and was given lyrica and cymbalta. She was scared to start lyrica and did not tolerate cymbalta. PT is just too expensive on her fixed income. Dry needling helped the most. She has lost 10bs from changing up her diet. She continues to see rheumatology. Her CRP has come down some.    .. Active Ambulatory Problems    Diagnosis Date Noted  . Psoriatic arthritis (HCC) 01/22/2017  . Morbid obesity (HCC) 01/25/2017  . Varicose veins of lower extremity 06/30/2019  . B12 deficiency 07/03/2019  . Vitamin D deficiency 07/03/2019  . Chronic right-sided low back pain with right-sided sciatica 11/20/2019  . Facet arthritis of lumbar region 11/21/2019  . DDD (degenerative disc disease), lumbar 11/21/2019  . Muscle spasm 11/21/2019  . Right leg weakness 11/21/2019  . Other intervertebral disc degeneration, lumbar region 11/21/2019  . Panic attack 03/13/2020  . Right leg swelling 05/07/2020  . Essential hypertension 08/15/2020  . Grief reaction 09/02/2020  .  Right foot pain 05/13/2021  . Left elbow pain 05/13/2021  . Metatarsalgia of right foot 05/13/2021   Resolved Ambulatory Problems    Diagnosis Date Noted  . Spinal stenosis of lumbosacral region 11/20/2019   No Additional Past Medical History     Review of Systems See HPI.     Objective:   Physical Exam Vitals reviewed.  Constitutional:      Appearance: Normal appearance. She is obese.  HENT:     Head: Normocephalic.  Cardiovascular:     Rate and Rhythm: Normal rate and regular rhythm.  Pulmonary:     Effort: Pulmonary effort is normal.  Abdominal:     General: Bowel sounds are normal.     Palpations: Abdomen is soft.  Musculoskeletal:     Right lower leg: No edema.     Left lower leg: No edema.     Left foot: Normal.       Legs:  Neurological:     General: No focal deficit present.     Mental Status: She is alert and oriented to person, place, and time.  Psychiatric:        Mood and Affect: Mood normal.    DIP/PIP joints of both hands with nodules and curved.        Assessment & Plan:  Marland KitchenMarland KitchenNyasha was seen today for pain.  Diagnoses and all orders for this visit:  Chronic right-sided low back pain with right-sided  sciatica -     diclofenac Sodium (VOLTAREN) 1 % GEL; Apply 4 g topically 4 (four) times daily. To affected joint.  Psoriatic arthritis (HCC) -     diclofenac Sodium (VOLTAREN) 1 % GEL; Apply 4 g topically 4 (four) times daily. To affected joint.  DDD (degenerative disc disease), lumbar -     diclofenac Sodium (VOLTAREN) 1 % GEL; Apply 4 g topically 4 (four) times daily. To affected joint.  Facet arthritis of lumbar region -     diclofenac Sodium (VOLTAREN) 1 % GEL; Apply 4 g topically 4 (four) times daily. To affected joint.  Morbid obesity (HCC)  Left elbow pain  Right foot pain -     Ambulatory referral to Podiatry  Metatarsalgia of right foot -     Ambulatory referral to Podiatry   Pt continues to struggle with pain and immobility  and fine motor dysfunction. Pt has deformity to her PIP,DIP joints in hands and fingers due to long term RA that effects o her ability for fine motor skills. Pt continues to have debilitating pain of low back that radiates to the right. Try more non-weight bearing low impact exercises.   Use voltaren gel everywhere.  Continue ibuprofen, methotrexate, prednisone, muscle relaxers.  Follow up with Dr. Karie Schwalbe for more interventional planning.  Continue ibuprofen, flexeril, metotrexate.  Pt should continue disability and is not able to work.   Consider tennis elbow wrap for left elbow. HO given.   Will make referral for metatarsalgia symptoms. Likely could consider a metatarsalgia pad.   Spent time in exam room filling out disability paperwork. Pt cannot work at this time.

## 2021-05-05 NOTE — Patient Instructions (Addendum)
Will make referral to podiatry.   Tennis Elbow  Tennis elbow is irritation and swelling (inflammation) in your outer forearm, near your elbow. Swelling affects the tissues that connect muscle to bone (tendons). Tennis elbow can happen playing any sport or doing any job where you use your elbow too much. It is caused by doing the same motion over and over. What are the causes? This condition is often caused by playing sports or doing work where you need to keep moving your forearm the same way. Sometimes, it may be caused by a sudden injury. What increases the risk? You are more likely to get tennis elbow if you play tennis or another racket sport. You also have a higher risk if you often use your hands for work. This includes:  People who use computers.  Holiday representative workers.  People who work in a factory.  Musicians.  Cooks.  Cashiers. What are the signs or symptoms?  Pain and tenderness in your forearm and the outer part of your elbow. You may have pain all the time or only when you use your arm.  A burning feeling. This starts in your elbow and spreads down your arm.  A weak grip in your hand. How is this treated? Resting and icing your arm is often the first treatment. Your doctor may also recommend:  Medicines to reduce pain and swelling.  An elbow strap.  Physical therapy. This may include massage or exercises or both.  An elbow brace. If these do not help your symptoms get better, your doctor may recommend surgery. Follow these instructions at home: If you have a brace or strap:  Wear the brace or strap as told by your doctor. Take it off only as told by your doctor.  Check the skin around the brace or strap every day. Tell your doctor if you see problems.  Loosen it if your fingers: ? Tingle. ? Become numb. ? Turn cold and blue.  Keep the brace or strap clean.  If the brace or strap is not waterproof: ? Do not let it get wet. ? Cover it with a watertight  covering when you take a bath or a shower. Managing pain, stiffness, and swelling  If told, put ice on the injured area. To do this: ? If you have a removable brace or strap, take it off as told by your doctor. ? Put ice in a plastic bag. ? Place a towel between your skin and the bag. ? Leave the ice on for 20 minutes, 2-3 times a day. ? Take off the ice if your skin turns bright red. This is very important. If you cannot feel pain, heat, or cold, you have a greater risk of damage to the area.  Move your fingers often.   Activity  Rest your elbow and wrist. Avoid activities that can cause elbow problems as told by your doctor.  Do exercises as told by your doctor.  If you lift an object, lift it with your palm facing up. Lifestyle  If your tennis elbow is caused by sports, check your equipment and make sure that: ? You are using it the right way. ? It fits you well.  If your tennis elbow is caused by work or computer use, take breaks often to stretch your arm. Talk with your manager about how you can make your condition better at work. General instructions  Take over-the-counter and prescription medicines only as told by your doctor.  Do not smoke or use  any products that contain nicotine or tobacco. If you need help quitting, ask your doctor.  Keep all follow-up visits. How is this prevented?  Before and after being active: ? Warm up and stretch before being active. ? Cool down and stretch after being active. ? Give your body time to rest between activities.  While being active: ? Make sure to use equipment that fits you. ? If you play tennis, put power in your stroke with your lower body. Avoid using your arm only.  Maintain physical fitness. This includes: ? Strength. ? Flexibility. ? Endurance.  Do exercises to strengthen the forearm muscles. Contact a doctor if:  Your pain does not get better with treatment.  Your pain gets worse.  You have weakness in your  forearm, hand, or fingers.  You cannot feel your forearm, hand, or fingers. Get help right away if:  Your pain is very bad.  You cannot move your wrist. Summary  Tennis elbow is irritation and swelling (inflammation) in your outer forearm, near your elbow.  Tennis elbow is caused by doing the same motion over and over.  Rest your elbow and wrist. Avoid activities as told by your doctor.  If told, put ice on the injured area for 20 minutes, 2-3 times a day. This information is not intended to replace advice given to you by your health care provider. Make sure you discuss any questions you have with your health care provider. Document Revised: 06/11/2020 Document Reviewed: 06/11/2020 Elsevier Patient Education  2021 [employer].

## 2021-05-13 ENCOUNTER — Telehealth: Payer: Self-pay | Admitting: Neurology

## 2021-05-13 DIAGNOSIS — M79671 Pain in right foot: Secondary | ICD-10-CM | POA: Insufficient documentation

## 2021-05-13 DIAGNOSIS — M7741 Metatarsalgia, right foot: Secondary | ICD-10-CM | POA: Insufficient documentation

## 2021-05-13 DIAGNOSIS — M25522 Pain in left elbow: Secondary | ICD-10-CM | POA: Insufficient documentation

## 2021-05-13 NOTE — Telephone Encounter (Signed)
-----   Message from Jomarie Longs, PA-C sent at 05/13/2021  8:31 AM EDT ----- Please combine with paperwork filled out for disability.

## 2021-05-13 NOTE — Telephone Encounter (Signed)
Forms and last office note faxed to [employer] at [phone] with confirmation received. Copy to scan.

## 2021-06-27 ENCOUNTER — Ambulatory Visit: Payer: Self-pay | Admitting: Podiatry

## 2021-08-13 ENCOUNTER — Encounter: Payer: Self-pay | Admitting: Family Medicine

## 2021-09-05 ENCOUNTER — Encounter: Payer: Self-pay | Admitting: Physician Assistant

## 2021-09-05 ENCOUNTER — Ambulatory Visit (INDEPENDENT_AMBULATORY_CARE_PROVIDER_SITE_OTHER): Payer: Self-pay | Admitting: Physician Assistant

## 2021-09-05 VITALS — BP 148/82 | HR 85 | Ht 64.0 in | Wt 260.0 lb

## 2021-09-05 DIAGNOSIS — L4050 Arthropathic psoriasis, unspecified: Secondary | ICD-10-CM

## 2021-09-05 DIAGNOSIS — Z13220 Encounter for screening for lipoid disorders: Secondary | ICD-10-CM

## 2021-09-05 DIAGNOSIS — M47816 Spondylosis without myelopathy or radiculopathy, lumbar region: Secondary | ICD-10-CM

## 2021-09-05 DIAGNOSIS — E559 Vitamin D deficiency, unspecified: Secondary | ICD-10-CM

## 2021-09-05 DIAGNOSIS — G8929 Other chronic pain: Secondary | ICD-10-CM

## 2021-09-05 DIAGNOSIS — Z79899 Other long term (current) drug therapy: Secondary | ICD-10-CM

## 2021-09-05 DIAGNOSIS — I10 Essential (primary) hypertension: Secondary | ICD-10-CM

## 2021-09-05 DIAGNOSIS — R29818 Other symptoms and signs involving the nervous system: Secondary | ICD-10-CM

## 2021-09-05 DIAGNOSIS — R7309 Other abnormal glucose: Secondary | ICD-10-CM

## 2021-09-05 DIAGNOSIS — M5136 Other intervertebral disc degeneration, lumbar region: Secondary | ICD-10-CM

## 2021-09-05 DIAGNOSIS — M5441 Lumbago with sciatica, right side: Secondary | ICD-10-CM

## 2021-09-05 DIAGNOSIS — R29898 Other symptoms and signs involving the musculoskeletal system: Secondary | ICD-10-CM

## 2021-09-05 NOTE — Progress Notes (Signed)
Subjective:    Patient ID: Sharon Tyler, female    DOB: 03-May-1966, 55 y.o.   MRN: [id number]  HPI Pt is a 55 yo obese female with chronic right sided low back pain, lumbar DDD, facet arthritis of lumbar region, Psoriatic arthritis who presents to the clinic for 4 month appt. She continues to see rheumatology for psoriatic arthritis.  She has worked at [employer] and now seeking long term disability. She is not able to stand for more than 20 minutes. She is not able to sit for more than 20 minutes. All bending, lifting, twisting is to be very limited. No lifting more than 10lbs. Very limited fine motor skills due to nodules of her bilateral hands from psoriatic arthritis. She can not write for longer than 5-10 minutes at a time due to arthritic hands.   .. Active Ambulatory Problems    Diagnosis Date Noted   Psoriatic arthritis (HCC) 01/22/2017   Morbid obesity (HCC) 01/25/2017   Varicose veins of lower extremity 06/30/2019   B12 deficiency 07/03/2019   Vitamin D deficiency 07/03/2019   Chronic right-sided low back pain with right-sided sciatica 11/20/2019   Facet arthritis of lumbar region 11/21/2019   DDD (degenerative disc disease), lumbar 11/21/2019   Muscle spasm 11/21/2019   Right leg weakness 11/21/2019   Other intervertebral disc degeneration, lumbar region 11/21/2019   Panic attack 03/13/2020   Right leg swelling 05/07/2020   Essential hypertension 08/15/2020   Grief reaction 09/02/2020   Right foot pain 05/13/2021   Left elbow pain 05/13/2021   Metatarsalgia of right foot 05/13/2021   Fine motor impairment 09/08/2021   Resolved Ambulatory Problems    Diagnosis Date Noted   Spinal stenosis of lumbosacral region 11/20/2019   No Additional Past Medical History      Review of Systems    See HPI.  Objective:   Physical Exam Vitals reviewed.  Constitutional:      Appearance: Normal appearance. She is obese. She is not ill-appearing.  HENT:     Head: Normocephalic.   Cardiovascular:     Rate and Rhythm: Normal rate and regular rhythm.     Pulses: Normal pulses.     Heart sounds: Normal heart sounds.  Pulmonary:     Effort: Pulmonary effort is normal.     Breath sounds: Normal breath sounds.  Musculoskeletal:     Comments: Herberdens nodes of bilateral hands with significant deviation of DIP in multiple joints.  Hand grip decreased bilateral hands.   Pain with flexion and extension a the waist.  Negative SLR, bilaterally.  Strength lower ext 5/5.   Lymphadenopathy:     Cervical: No cervical adenopathy.  Neurological:     General: No focal deficit present.     Mental Status: She is alert and oriented to person, place, and time.  Psychiatric:        Mood and Affect: Mood normal.          Assessment & Plan:  Marland KitchenMarland KitchenMaryam was seen today for follow-up.  Diagnoses and all orders for this visit:  Psoriatic arthritis (HCC)  Screening for lipid disorders -     Cancel: Lipid Panel w/reflex Direct LDL -     Lipid Panel w/reflex Direct LDL  Vitamin D deficiency -     Vitamin D (25 hydroxy)  Elevated glucose -     Hemoglobin A1c  Medication management -     COMPLETE METABOLIC PANEL WITH GFR  Essential hypertension  Facet arthritis of lumbar region  Other intervertebral disc degeneration, lumbar region  Chronic right-sided low back pain with right-sided sciatica  DDD (degenerative disc disease), lumbar  Fine motor impairment  Pt continues to be in chronic pain from her multiple above issues and in significant dysfunction with fine and gross motor skills and basic work functions. She is unable to consisently meet standard work requirements due to her above health issues. Pt should be strongly consider for disability. See limitations in HPI.  Sent notes to [employer].

## 2021-09-08 DIAGNOSIS — R29818 Other symptoms and signs involving the nervous system: Secondary | ICD-10-CM | POA: Insufficient documentation

## 2021-09-08 DIAGNOSIS — R29898 Other symptoms and signs involving the musculoskeletal system: Secondary | ICD-10-CM | POA: Insufficient documentation

## 2022-01-09 ENCOUNTER — Encounter: Payer: Self-pay | Admitting: Physician Assistant

## 2022-01-09 ENCOUNTER — Ambulatory Visit (INDEPENDENT_AMBULATORY_CARE_PROVIDER_SITE_OTHER): Payer: Self-pay | Admitting: Physician Assistant

## 2022-01-09 ENCOUNTER — Other Ambulatory Visit: Payer: Self-pay

## 2022-01-09 VITALS — BP 135/69 | HR 99 | Ht 64.0 in | Wt 253.0 lb

## 2022-01-09 DIAGNOSIS — M5136 Other intervertebral disc degeneration, lumbar region: Secondary | ICD-10-CM

## 2022-01-09 DIAGNOSIS — G8929 Other chronic pain: Secondary | ICD-10-CM

## 2022-01-09 DIAGNOSIS — M5441 Lumbago with sciatica, right side: Secondary | ICD-10-CM

## 2022-01-09 DIAGNOSIS — M47816 Spondylosis without myelopathy or radiculopathy, lumbar region: Secondary | ICD-10-CM

## 2022-01-09 DIAGNOSIS — L4050 Arthropathic psoriasis, unspecified: Secondary | ICD-10-CM

## 2022-01-09 DIAGNOSIS — I10 Essential (primary) hypertension: Secondary | ICD-10-CM

## 2022-01-09 NOTE — Progress Notes (Signed)
° °  Subjective:    Patient ID: Sharon Tyler, female    DOB: [birth year], 56 y.o.   MRN: [id number]  HPI Pt is a 56 yo obese female with Psoratic Arthritis, chronic low back pain, lumbar DDD, facet arthritis of lumbar spine, HTN who presents to the clinic for follow up. She continues to be in daily pain and stiffness. Her hands are getting worse with more and more stiffness and difficulty with fine motor skills. Using voltaren gel, flexeril and ibuprofen has needed. On methotrexate and prednisone for psoriatic arthritis. 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 She has lost about 8lbs. She is trying to walk regularly and have a healthier diet.  .. Active Ambulatory Problems    Diagnosis Date Noted   Psoriatic arthritis (HCC) 01/22/2017   Morbid obesity (HCC) 01/25/2017   Varicose veins of lower extremity 06/30/2019   B12 deficiency 07/03/2019   Vitamin D deficiency 07/03/2019   Chronic right-sided low back pain with right-sided sciatica 11/20/2019   Facet arthritis of lumbar region 11/21/2019   DDD (degenerative disc disease), lumbar 11/21/2019   Muscle spasm 11/21/2019   Right leg weakness 11/21/2019   Other intervertebral disc degeneration, lumbar region 11/21/2019   Panic attack 03/13/2020   Right leg swelling 05/07/2020   Essential hypertension 08/15/2020   Grief reaction 09/02/2020   Right foot pain 05/13/2021   Left elbow pain 05/13/2021   Metatarsalgia of right foot 05/13/2021   Fine motor impairment 09/08/2021   Resolved Ambulatory Problems    Diagnosis Date Noted   Spinal stenosis of lumbosacral region 11/20/2019   No Additional Past Medical History     Review of Systems  All other systems reviewed and are negative.     Objective:   Physical Exam Vitals reviewed.  Constitutional:      Appearance: Normal appearance. She is obese.  HENT:     Head: Normocephalic.  Cardiovascular:     Rate and Rhythm: Normal rate and regular  rhythm.     Heart sounds: Normal heart sounds.  Pulmonary:     Effort: Pulmonary effort is normal.     Breath sounds: Normal breath sounds.  Musculoskeletal:     Comments: Decreased ROM at waist with lifting, pushing, squating.  Hands with swan neck deformation and joint enlargement and swelling.   Neurological:     Mental Status: She is alert.          Assessment & Plan:  Marland KitchenMarland KitchenRosalena was seen today for follow-up.  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 Agree with disability and decreased mobility and fine motor skills due to chronic condition.   Failed cymbalta for chronic pain.  Continue follow up with rheumatologist.   Declined covid/flu/pneumonia/shingles vaccine.   Follow up in 6 months.

## 2022-01-14 ENCOUNTER — Encounter: Payer: Self-pay | Admitting: Physician Assistant

## 2022-07-13 ENCOUNTER — Ambulatory Visit: Payer: Self-pay | Admitting: Physician Assistant

## 2022-07-22 ENCOUNTER — Encounter: Payer: Self-pay | Admitting: Physician Assistant

## 2022-07-22 ENCOUNTER — Ambulatory Visit (INDEPENDENT_AMBULATORY_CARE_PROVIDER_SITE_OTHER): Payer: Medicare Other | Admitting: Physician Assistant

## 2022-07-22 VITALS — BP 143/74 | HR 86 | Ht 64.0 in | Wt 253.0 lb

## 2022-07-22 DIAGNOSIS — R7301 Impaired fasting glucose: Secondary | ICD-10-CM

## 2022-07-22 DIAGNOSIS — N3001 Acute cystitis with hematuria: Secondary | ICD-10-CM | POA: Diagnosis not present

## 2022-07-22 DIAGNOSIS — Z1329 Encounter for screening for other suspected endocrine disorder: Secondary | ICD-10-CM

## 2022-07-22 DIAGNOSIS — M47816 Spondylosis without myelopathy or radiculopathy, lumbar region: Secondary | ICD-10-CM

## 2022-07-22 DIAGNOSIS — M5136 Other intervertebral disc degeneration, lumbar region: Secondary | ICD-10-CM

## 2022-07-22 DIAGNOSIS — D7589 Other specified diseases of blood and blood-forming organs: Secondary | ICD-10-CM | POA: Diagnosis not present

## 2022-07-22 DIAGNOSIS — R339 Retention of urine, unspecified: Secondary | ICD-10-CM | POA: Diagnosis not present

## 2022-07-22 DIAGNOSIS — I10 Essential (primary) hypertension: Secondary | ICD-10-CM

## 2022-07-22 DIAGNOSIS — L4050 Arthropathic psoriasis, unspecified: Secondary | ICD-10-CM

## 2022-07-22 LAB — POCT URINALYSIS DIP (CLINITEK)
Bilirubin, UA: NEGATIVE
Glucose, UA: NEGATIVE mg/dL
Ketones, POC UA: NEGATIVE mg/dL
Nitrite, UA: NEGATIVE
Spec Grav, UA: 1.02 (ref 1.010–1.025)
Urobilinogen, UA: 1 E.U./dL
pH, UA: 6 (ref 5.0–8.0)

## 2022-07-22 MED ORDER — NITROFURANTOIN MONOHYD MACRO 100 MG PO CAPS: 100.0000 mg | ORAL_CAPSULE | Freq: Two times a day (BID) | ORAL | 0 refills | Status: DC

## 2022-07-22 NOTE — Progress Notes (Signed)
Established Patient Office Visit  Subjective   Patient ID: Sharon Tyler, female    DOB: Apr 04, 1966  Age: 56 y.o. MRN: [id number]  Chief Complaint  Patient presents with   Follow-up    HPI Pt is a 56 yo obese female with psoratic arthritis, DDD, HTN who presents to the clinic for follow up.   Pt was approved for disability and needs paperwork filled out. Pt continues to not be able to stand, bend, lift, push, carry, walk, write for greater than 5-10 minutes at a time.   Denies any CP, palpitations, sOB.   She is having urinary symptoms for the last week. Hx of UTI in spring. No fever, chills, body aches, nausea, vomiting. Not tried anything to make better. Feels urgency but not a lot is coming out.   Rheumatology blood work showed macrocytosis without anemia. Pt is concerned. She is taking folate with methotrexate.   .. Active Ambulatory Problems    Diagnosis Date Noted   Psoriatic arthritis (HCC) 01/22/2017   Morbid obesity (HCC) 01/25/2017   Varicose veins of lower extremity 06/30/2019   B12 deficiency 07/03/2019   Vitamin D deficiency 07/03/2019   Chronic right-sided low back pain with right-sided sciatica 11/20/2019   Facet arthritis of lumbar region 11/21/2019   DDD (degenerative disc disease), lumbar 11/21/2019   Muscle spasm 11/21/2019   Right leg weakness 11/21/2019   Other intervertebral disc degeneration, lumbar region 11/21/2019   Panic attack 03/13/2020   Right leg swelling 05/07/2020   Essential hypertension 08/15/2020   Grief reaction 09/02/2020   Right foot pain 05/13/2021   Left elbow pain 05/13/2021   Metatarsalgia of right foot 05/13/2021   Fine motor impairment 09/08/2021   Macrocytosis 07/24/2022   Urinary retention 07/24/2022   Elevated fasting glucose 07/24/2022   Resolved Ambulatory Problems    Diagnosis Date Noted   Spinal stenosis of lumbosacral region 11/20/2019   No Additional Past Medical History     ROS See HPI.    Objective:      BP (!) 143/74   Pulse 86   Ht 5\' 4"  (1.626 m)   Wt 253 lb (114.8 kg)   SpO2 99%   BMI 43.43 kg/m  BP Readings from Last 3 Encounters:  07/22/22 (!) 143/74  01/09/22 135/69  09/05/21 (!) 148/82   Wt Readings from Last 3 Encounters:  07/22/22 253 lb (114.8 kg)  01/09/22 253 lb (114.8 kg)  09/05/21 260 lb (117.9 kg)    .09/07/21 Results for orders placed or performed in visit on 07/22/22  Urine Culture   Specimen: Urine  Result Value Ref Range   MICRO NUMBER: 09/21/22    SPECIMEN QUALITY: Adequate    Sample Source URINE    STATUS: FINAL    Result:      Mixed genital flora isolated. These superficial bacteria are not indicative of a urinary tract infection. No further organism identification is warranted on this specimen. If clinically indicated, recollect clean-catch, mid-stream urine and transfer  immediately to Urine Culture Transport Tube.   POCT URINALYSIS DIP (CLINITEK)  Result Value Ref Range   Color, UA yellow yellow   Clarity, UA clear clear   Glucose, UA negative negative mg/dL   Bilirubin, UA negative negative   Ketones, POC UA negative negative mg/dL   Spec Grav, UA [id number] 6.812 - 1.025   Blood, UA small (A) negative   pH, UA 6.0 5.0 - 8.0   POC PROTEIN,UA trace negative, trace   Urobilinogen, UA 1.0  0.2 or 1.0 E.U./dL   Nitrite, UA Negative Negative   Leukocytes, UA Moderate (2+) (A) Negative     Physical Exam Constitutional:      Appearance: Normal appearance. She is obese.  HENT:     Head: Normocephalic.  Cardiovascular:     Rate and Rhythm: Normal rate and regular rhythm.     Pulses: Normal pulses.     Heart sounds: Normal heart sounds.  Pulmonary:     Effort: Pulmonary effort is normal.     Breath sounds: Normal breath sounds.  Abdominal:     General: There is no distension.     Palpations: Abdomen is soft. There is no mass.     Tenderness: There is abdominal tenderness. There is no right CVA tenderness, left CVA tenderness, guarding or rebound.      Comments: Suprapubic discomfort to palpation.   Neurological:     General: No focal deficit present.     Mental Status: She is alert and oriented to person, place, and time.  Psychiatric:        Mood and Affect: Mood normal.       Assessment & Plan:  Marland KitchenMarland KitchenHarmani was seen today for follow-up.  Diagnoses and all orders for this visit:  Urinary retention -     POCT URINALYSIS DIP (CLINITEK) -     Urine Culture  Macrocytosis  Acute cystitis with hematuria -     nitrofurantoin, macrocrystal-monohydrate, (MACROBID) 100 MG capsule; Take 1 capsule (100 mg total) by mouth 2 (two) times daily.   UA positive for leuks and blood. Will start macrobid and culture Discussed symptomatic care  Arthritis managed by rheumatology Increase folic acid due to macrocytosis without anemia. Discussed A1C and TSH check pt would like to hold off until rheumatology does bloodwork  Filled out paperwork to continue disability due to mobility, fine and gross motor skills and sequela of psoriatic arthritis and DDD.   Tandy Gaw, PA-C

## 2022-07-22 NOTE — Patient Instructions (Signed)
Urinary Tract Infection, Adult A urinary tract infection (UTI) is an infection of any part of the urinary tract. The urinary tract includes: The kidneys. The ureters. The bladder. The urethra. These organs make, store, and get rid of pee (urine) in the body. What are the causes? This infection is caused by germs (bacteria) in your genital area. These germs grow and cause swelling (inflammation) of your urinary tract. What increases the risk? The following factors may make you more likely to develop this condition: Using a small, thin tube (catheter) to drain pee. Not being able to control when you pee or poop (incontinence). Being female. If you are female, these things can increase the risk: Using these methods to prevent pregnancy: A medicine that kills sperm (spermicide). A device that blocks sperm (diaphragm). Having low levels of a female hormone (estrogen). Being pregnant. You are more likely to develop this condition if: You have genes that add to your risk. You are sexually active. You take antibiotic medicines. You have trouble peeing because of: A prostate that is bigger than normal, if you are female. A blockage in the part of your body that drains pee from the bladder. A kidney stone. A nerve condition that affects your bladder. Not getting enough to drink. Not peeing often enough. You have other conditions, such as: Diabetes. A weak disease-fighting system (immune system). Sickle cell disease. Gout. Injury of the spine. What are the signs or symptoms? Symptoms of this condition include: Needing to pee right away. Peeing small amounts often. Pain or burning when peeing. Blood in the pee. Pee that smells bad or not like normal. Trouble peeing. Pee that is cloudy. Fluid coming from the vagina, if you are female. Pain in the belly or lower back. Other symptoms include: Vomiting. Not feeling hungry. Feeling mixed up (confused). This may be the first symptom in  older adults. Being tired and grouchy (irritable). A fever. Watery poop (diarrhea). How is this treated? Taking antibiotic medicine. Taking other medicines. Drinking enough water. In some cases, you may need to see a specialist. Follow these instructions at home:  Medicines Take over-the-counter and prescription medicines only as told by your doctor. If you were prescribed an antibiotic medicine, take it as told by your doctor. Do not stop taking it even if you start to feel better. General instructions Make sure you: Pee until your bladder is empty. Do not hold pee for a long time. Empty your bladder after sex. Wipe from front to back after peeing or pooping if you are a female. Use each tissue one time when you wipe. Drink enough fluid to keep your pee pale yellow. Keep all follow-up visits. Contact a doctor if: You do not get better after 1-2 days. Your symptoms go away and then come back. Get help right away if: You have very bad back pain. You have very bad pain in your lower belly. You have a fever. You have chills. You feeling like you will vomit or you vomit. Summary A urinary tract infection (UTI) is an infection of any part of the urinary tract. This condition is caused by germs in your genital area. There are many risk factors for a UTI. Treatment includes antibiotic medicines. Drink enough fluid to keep your pee pale yellow. This information is not intended to replace advice given to you by your health care provider. Make sure you discuss any questions you have with your health care provider. Document Revised: 07/12/2020 Document Reviewed: 07/12/2020 Elsevier Patient Education    2023 Elsevier Inc.  

## 2022-07-24 DIAGNOSIS — R7301 Impaired fasting glucose: Secondary | ICD-10-CM | POA: Insufficient documentation

## 2022-07-24 DIAGNOSIS — D7589 Other specified diseases of blood and blood-forming organs: Secondary | ICD-10-CM | POA: Insufficient documentation

## 2022-07-24 DIAGNOSIS — R339 Retention of urine, unspecified: Secondary | ICD-10-CM | POA: Insufficient documentation

## 2022-07-24 LAB — URINE CULTURE
MICRO NUMBER:: 13761320
SPECIMEN QUALITY:: ADEQUATE

## 2022-07-24 NOTE — Progress Notes (Signed)
Cultured did not show UTI but mixed normal superficial bacteria.

## 2022-08-05 ENCOUNTER — Encounter: Payer: Self-pay | Admitting: General Practice

## 2022-08-10 ENCOUNTER — Other Ambulatory Visit: Payer: Self-pay | Admitting: Neurology

## 2022-08-10 DIAGNOSIS — N3001 Acute cystitis with hematuria: Secondary | ICD-10-CM

## 2023-01-12 ENCOUNTER — Ambulatory Visit (INDEPENDENT_AMBULATORY_CARE_PROVIDER_SITE_OTHER): Payer: Medicare Other | Admitting: Physician Assistant

## 2023-01-12 ENCOUNTER — Encounter: Payer: Self-pay | Admitting: Physician Assistant

## 2023-01-12 VITALS — BP 128/61 | HR 100 | Ht 64.0 in | Wt 261.0 lb

## 2023-01-12 DIAGNOSIS — R7309 Other abnormal glucose: Secondary | ICD-10-CM

## 2023-01-12 DIAGNOSIS — Z13220 Encounter for screening for lipoid disorders: Secondary | ICD-10-CM

## 2023-01-12 DIAGNOSIS — I10 Essential (primary) hypertension: Secondary | ICD-10-CM | POA: Diagnosis not present

## 2023-01-12 DIAGNOSIS — Z79899 Other long term (current) drug therapy: Secondary | ICD-10-CM

## 2023-01-12 DIAGNOSIS — E538 Deficiency of other specified B group vitamins: Secondary | ICD-10-CM

## 2023-01-12 DIAGNOSIS — M5136 Other intervertebral disc degeneration, lumbar region: Secondary | ICD-10-CM

## 2023-01-12 DIAGNOSIS — L4050 Arthropathic psoriasis, unspecified: Secondary | ICD-10-CM

## 2023-01-12 DIAGNOSIS — E559 Vitamin D deficiency, unspecified: Secondary | ICD-10-CM

## 2023-01-12 DIAGNOSIS — R718 Other abnormality of red blood cells: Secondary | ICD-10-CM

## 2023-01-12 DIAGNOSIS — M51369 Other intervertebral disc degeneration, lumbar region without mention of lumbar back pain or lower extremity pain: Secondary | ICD-10-CM

## 2023-01-12 DIAGNOSIS — Z1329 Encounter for screening for other suspected endocrine disorder: Secondary | ICD-10-CM

## 2023-01-12 MED ORDER — FOLIC ACID 1 MG PO TABS: 1.0000 mg | ORAL_TABLET | Freq: Every day | ORAL | 3 refills | Status: DC

## 2023-01-12 NOTE — Progress Notes (Signed)
Established Patient Office Visit  Subjective   Patient ID: Sharon Tyler, female    DOB: 06/01/1966  Age: 57 y.o. MRN: [id number]  Chief Complaint  Patient presents with   Follow-up    HPI Pt is a 57 yo obese female with psoratic arthritis, DDD, HTN who presents to the clinic for follow up.   Pt sees rheumatology regularly for psoratic arthritis. She is on methotrexate and folic acid and prednisone. She is on disability.   She continues to have low back pain that seems to be worsening. Rheumatology states to follow up with PCP.   Concerned about elevated MCV and decreased RBC. Wonders if she could have b12 shots.   IMPRESSION: 1. Severe left facet arthrosis at L5-S1. 2. Small left foraminal disc protrusions at L2-3 and L3-4 without stenosis.  Patient Active Problem List   Diagnosis Date Noted   Abnormal RBC 01/18/2023   Elevated MCV 01/18/2023   Macrocytosis 07/24/2022   Urinary retention 07/24/2022   Elevated fasting glucose 07/24/2022   Fine motor impairment 09/08/2021   Right foot pain 05/13/2021   Left elbow pain 05/13/2021   Metatarsalgia of right foot 05/13/2021   Grief reaction 09/02/2020   Essential hypertension 08/15/2020   Right leg swelling 05/07/2020   Panic attack 03/13/2020   Facet arthritis of lumbar region 11/21/2019   DDD (degenerative disc disease), lumbar 11/21/2019   Muscle spasm 11/21/2019   Right leg weakness 11/21/2019   Other intervertebral disc degeneration, lumbar region 11/21/2019   Chronic right-sided low back pain with right-sided sciatica 11/20/2019   B12 deficiency 07/03/2019   Vitamin D deficiency 07/03/2019   Varicose veins of lower extremity 06/30/2019   Morbid obesity ([place]) 01/25/2017   Psoriatic arthritis ([place]) 01/22/2017   History reviewed. No pertinent past medical history. Family History  Problem Relation Age of Onset   Cancer Mother        breast   Cancer Father    Hypertension Brother    Cancer Maternal Aunt         colon   Allergies  Allergen Reactions   Cymbalta [Duloxetine Hcl]     Prickly feet/palpitations/feet swollen      ROS See HPI.    Objective:     BP 128/61   Pulse 100   Ht 5\' 4"  (1.626 m)   Wt 261 lb (118.4 kg)   SpO2 99%   BMI 44.80 kg/m  BP Readings from Last 3 Encounters:  01/12/23 128/61  07/22/22 (!) 143/74  01/09/22 135/69   Wt Readings from Last 3 Encounters:  01/12/23 261 lb (118.4 kg)  07/22/22 253 lb (114.8 kg)  01/09/22 253 lb (114.8 kg)      Physical Exam Vitals reviewed.  Constitutional:      Appearance: Normal appearance. She is obese.  HENT:     Head: Normocephalic.  Cardiovascular:     Rate and Rhythm: Normal rate and regular rhythm.  Pulmonary:     Effort: Pulmonary effort is normal.     Breath sounds: Normal breath sounds.  Neurological:     General: No focal deficit present.     Mental Status: She is alert and oriented to person, place, and time.  Psychiatric:        Mood and Affect: Mood normal.        Assessment & Plan:  Marland KitchenMarland KitchenAvice was seen today for follow-up.  Diagnoses and all orders for this visit:  Essential hypertension -     COMPLETE METABOLIC PANEL WITH  GFR  Vitamin D deficiency -     Vitamin D (25 hydroxy)  Elevated glucose  Medication management -     TSH -     Lipid Panel w/reflex Direct LDL -     COMPLETE METABOLIC PANEL WITH GFR -     CBC with Differential/Platelet -     Vitamin D (25 hydroxy)  B12 deficiency  Thyroid disorder screen -     TSH  Lipid screening -     Lipid Panel w/reflex Direct LDL  Psoriatic arthritis ([facility]) -     folic acid (FOLVITE) 1 MG tablet; Take 1 tablet (1 mg total) by mouth daily. -     Sed Rate (ESR) -     C-reactive protein  DDD (degenerative disc disease), lumbar -     Sed Rate (ESR) -     C-reactive protein  Elevated MCV -     B12 and Folate Panel -     Ambulatory referral to Hematology / Oncology  Abnormal RBC -     Ambulatory referral to Hematology /  Oncology   Vital signs look GREAT.  Will get labs Will recheck B12 to see if she is a candidate for replacement Refilled folic acid for pt to take with methotrexate Referral to hematology for patient  Discussed lumbar DDD, consider Dr. Darene Lamer for management.  Reviewed last MRI.  Pt does declined injections, gabapentin, lyrica, cymbalta for management.  Follow up in 6 months.    Iran Planas, PA-C

## 2023-01-13 LAB — LIPID PANEL W/REFLEX DIRECT LDL
Cholesterol: 167 mg/dL (ref <200)
HDL: 35 mg/dL — ABNORMAL LOW (ref >=50)
LDL Cholesterol (Calc): 113 mg/dL (calc) — ABNORMAL HIGH
Non-HDL Cholesterol (Calc): 132 mg/dL (calc) — ABNORMAL HIGH (ref <130)
Total CHOL/HDL Ratio: 4.8 (calc) (ref <5.0)
Triglycerides: 86 mg/dL (ref <150)

## 2023-01-13 LAB — CBC WITH DIFFERENTIAL/PLATELET
Absolute Monocytes: 398 cells/uL (ref 200–950)
Basophils Absolute: 38 cells/uL (ref 0–200)
Basophils Relative: 0.5 %
Eosinophils Absolute: 180 cells/uL (ref 15–500)
Eosinophils Relative: 2.4 %
HCT: 35.9 % (ref 35.0–45.0)
Hemoglobin: 12.7 g/dL (ref 11.7–15.5)
Lymphs Abs: 1380 cells/uL (ref 850–3900)
MCH: 34.7 pg — ABNORMAL HIGH (ref 27.0–33.0)
MCHC: 35.4 g/dL (ref 32.0–36.0)
MCV: 98.1 fL (ref 80.0–100.0)
MPV: 11 fL (ref 7.5–12.5)
Monocytes Relative: 5.3 %
Neutro Abs: 5505 cells/uL (ref 1500–7800)
Neutrophils Relative %: 73.4 %
Platelets: 328 10*3/uL (ref 140–400)
RBC: 3.66 10*6/uL — ABNORMAL LOW (ref 3.80–5.10)
RDW: 13.3 % (ref 11.0–15.0)
Total Lymphocyte: 18.4 %
WBC: 7.5 10*3/uL (ref 3.8–10.8)

## 2023-01-13 LAB — COMPLETE METABOLIC PANEL WITH GFR
AG Ratio: 1.7 (calc) (ref 1.0–2.5)
ALT: 19 U/L (ref 6–29)
AST: 22 U/L (ref 10–35)
Albumin: 4.3 g/dL (ref 3.6–5.1)
Alkaline phosphatase (APISO): 98 U/L (ref 37–153)
BUN: 8 mg/dL (ref 7–25)
CO2: 27 mmol/L (ref 20–32)
Calcium: 9.1 mg/dL (ref 8.6–10.4)
Chloride: 106 mmol/L (ref 98–110)
Creat: 0.82 mg/dL (ref 0.50–1.03)
Globulin: 2.6 g/dL (calc) (ref 1.9–3.7)
Glucose, Bld: 100 mg/dL — ABNORMAL HIGH (ref 65–99)
Potassium: 4.2 mmol/L (ref 3.5–5.3)
Sodium: 140 mmol/L (ref 135–146)
Total Bilirubin: 1 mg/dL (ref 0.2–1.2)
Total Protein: 6.9 g/dL (ref 6.1–8.1)
eGFR: 83 mL/min/{1.73_m2} (ref >=60)

## 2023-01-13 LAB — C-REACTIVE PROTEIN: CRP: 13.8 mg/L — ABNORMAL HIGH (ref <8.0)

## 2023-01-13 LAB — VITAMIN D 25 HYDROXY (VIT D DEFICIENCY, FRACTURES): Vit D, 25-Hydroxy: 17 ng/mL — ABNORMAL LOW (ref 30–100)

## 2023-01-13 LAB — B12 AND FOLATE PANEL
Folate: 6.7 ng/mL
Vitamin B-12: 281 pg/mL (ref 200–1100)

## 2023-01-13 LAB — SEDIMENTATION RATE: Sed Rate: 43 mm/h — ABNORMAL HIGH (ref 0–30)

## 2023-01-13 LAB — TSH: TSH: 5.16 mIU/L — ABNORMAL HIGH (ref 0.40–4.50)

## 2023-01-13 NOTE — Progress Notes (Signed)
Darriona,   Sed rate and CRP are elevated indicating inflammation.  TSH is increased showing signs of hypo (low) thyroid. Thyroid supplementation could make you feel a little better. Thoughts?   LDL not to optimal goal. Goal to get under 100.  HDL goal is above 50.   Vitamin D low. Make sure taking at least 2000 units daily.  B12 on the low side. You could get b12 shots once a month and then retest in 3 months. It could help the RBC increase as well.  Folate level is normal range.

## 2023-01-18 ENCOUNTER — Encounter: Payer: Self-pay | Admitting: Physician Assistant

## 2023-01-18 DIAGNOSIS — R718 Other abnormality of red blood cells: Secondary | ICD-10-CM | POA: Insufficient documentation

## 2023-03-08 ENCOUNTER — Telehealth: Payer: Self-pay | Admitting: Physician Assistant

## 2023-03-08 NOTE — Telephone Encounter (Signed)
Called patient to schedule Medicare Annual Wellness Visit (AWV). Left message for patient to call back and schedule Medicare Annual Wellness Visit (AWV).  Last date of AWV: Never  Please schedule an appointment at any time with [facility] or PCP.  If any questions, please contact me at [phone].  Thank you ,  Lin Givens Patient Access Adv[facility] Direct Dial: [phone]

## 2023-03-26 ENCOUNTER — Telehealth: Payer: Self-pay | Admitting: Physician Assistant

## 2023-03-26 NOTE — Telephone Encounter (Signed)
Contacted Sharon Tyler to schedule their annual wellness visit. Welcome to Medicare visit Due by 04/14/2023.  Thank you,  [facility] Support [facility] Direct dial  [phone]

## 2023-07-13 ENCOUNTER — Encounter: Payer: Self-pay | Admitting: Physician Assistant

## 2023-07-13 ENCOUNTER — Ambulatory Visit (INDEPENDENT_AMBULATORY_CARE_PROVIDER_SITE_OTHER): Payer: Medicare Other | Admitting: Physician Assistant

## 2023-07-13 VITALS — BP 128/58 | HR 81 | Ht 64.0 in | Wt 260.1 lb

## 2023-07-13 DIAGNOSIS — E538 Deficiency of other specified B group vitamins: Secondary | ICD-10-CM

## 2023-07-13 DIAGNOSIS — I10 Essential (primary) hypertension: Secondary | ICD-10-CM | POA: Diagnosis not present

## 2023-07-13 DIAGNOSIS — G479 Sleep disorder, unspecified: Secondary | ICD-10-CM | POA: Diagnosis not present

## 2023-07-13 DIAGNOSIS — E559 Vitamin D deficiency, unspecified: Secondary | ICD-10-CM

## 2023-07-13 DIAGNOSIS — L4050 Arthropathic psoriasis, unspecified: Secondary | ICD-10-CM

## 2023-07-13 DIAGNOSIS — R710 Precipitous drop in hematocrit: Secondary | ICD-10-CM

## 2023-07-13 DIAGNOSIS — R7989 Other specified abnormal findings of blood chemistry: Secondary | ICD-10-CM

## 2023-07-13 DIAGNOSIS — R5383 Other fatigue: Secondary | ICD-10-CM

## 2023-07-13 MED ORDER — CYANOCOBALAMIN 1000 MCG/ML IJ SOLN
1000.0000 ug | Freq: Once | INTRAMUSCULAR | Status: AC
Start: 2023-07-13 — End: 2023-07-13
  Administered 2023-07-13: 1000 ug via INTRAMUSCULAR

## 2023-07-13 MED ORDER — VITAMIN D (ERGOCALCIFEROL) 1.25 MG (50000 UNIT) PO CAPS
50000.0000 [IU] | ORAL_CAPSULE | ORAL | 0 refills | Status: DC
Start: 2023-07-13 — End: 2024-04-11

## 2023-07-13 MED ORDER — VITAMIN D (ERGOCALCIFEROL) 1.25 MG (50000 UNIT) PO CAPS: 50000.0000 [IU] | ORAL_CAPSULE | ORAL | 0 refills | Status: DC

## 2023-07-13 NOTE — Progress Notes (Unsigned)
Established Patient Office Visit  Subjective   Patient ID: Sharon Tyler, female    DOB: 1966/03/01  Age: 57 y.o. MRN: [id number]  Chief Complaint  Patient presents with   Hypertension    follow up    HPI Pt is a 57 yo obese female with Psoriatic arthritis, vitamin D deficiency, lumbar DDD with chronic low back pain who presents to the clinic for follow up.   She feels like "her body is falling a part". She feels so tired. She has chronic joint pain and low back pain. She feels like she cannot really do much of anything comfortably. She does not have much of appetite but is eating. She does not enjoy doing anything. She is having ocular migraines. She is not checking BP at home. Last labs at rheumatology showed low RBC and hemoglobin and protein. She is not taking any iron.   Needs handicap form for DMV.   Active Ambulatory Problems    Diagnosis Date Noted   Psoriatic arthritis (HCC) 01/22/2017   Morbid obesity (HCC) 01/25/2017   Varicose veins of lower extremity 06/30/2019   B12 deficiency 07/03/2019   Vitamin D deficiency 07/03/2019   Chronic right-sided low back pain with right-sided sciatica 11/20/2019   Facet arthritis of lumbar region 11/21/2019   DDD (degenerative disc disease), lumbar 11/21/2019   Muscle spasm 11/21/2019   Right leg weakness 11/21/2019   Other intervertebral disc degeneration, lumbar region 11/21/2019   Panic attack 03/13/2020   Right leg swelling 05/07/2020   Essential hypertension 08/15/2020   Grief reaction 09/02/2020   Right foot pain 05/13/2021   Left elbow pain 05/13/2021   Metatarsalgia of right foot 05/13/2021   Fine motor impairment 09/08/2021   Macrocytosis 07/24/2022   Urinary retention 07/24/2022   Elevated fasting glucose 07/24/2022   Abnormal RBC 01/18/2023   Elevated MCV 01/18/2023   Trouble in sleeping 07/13/2023   Decreased hemoglobin 07/14/2023   Elevated TSH 07/14/2023   Resolved Ambulatory Problems    Diagnosis Date  Noted   Spinal stenosis of lumbosacral region 11/20/2019   No Additional Past Medical History     ROS See HPI.    Objective:     BP (!) 128/58   Pulse 81   Ht 5\' 4"  (1.626 m)   Wt 260 lb 1.3 oz (118 kg)   SpO2 99%   BMI 44.64 kg/m  BP Readings from Last 3 Encounters:  07/13/23 (!) 128/58  01/12/23 128/61  07/22/22 (!) 143/74   Wt Readings from Last 3 Encounters:  07/13/23 260 lb 1.3 oz (118 kg)  01/12/23 261 lb (118.4 kg)  07/22/22 253 lb (114.8 kg)   .Marland Kitchen    07/13/2023   10:24 AM 01/12/2023   10:54 AM 09/05/2021   11:09 AM 02/04/2021   10:59 AM 11/20/2019    9:09 AM  Depression screen PHQ 2/9  Decreased Interest 0 0 0 0 0  Down, Depressed, Hopeless 0 0 0 0 1  PHQ - 2 Score 0 0 0 0 1  Altered sleeping    0 1  Tired, decreased energy    0 1  Change in appetite    0 0  Feeling bad or failure about yourself     0 0  Trouble concentrating    0 0  Moving slowly or fidgety/restless    0 0  Suicidal thoughts    0 0  PHQ-9 Score    0 3  Difficult doing work/chores  Not difficult at all Somewhat difficult       Physical Exam Constitutional:      Appearance: Normal appearance. She is obese.  Cardiovascular:     Rate and Rhythm: Normal rate and regular rhythm.  Pulmonary:     Effort: Pulmonary effort is normal.  Musculoskeletal:     Right lower leg: No edema.     Left lower leg: No edema.  Neurological:     General: No focal deficit present.     Mental Status: She is alert and oriented to person, place, and time.  Psychiatric:        Mood and Affect: Mood normal.          Assessment & Plan:  Marland KitchenMarland KitchenDenease was seen today for hypertension.  Diagnoses and all orders for this visit:  Essential hypertension  Psoriatic arthritis (HCC)  Vitamin D deficiency -     Vitamin D, Ergocalciferol, (DRISDOL) 1.25 MG (50000 UNIT) CAPS capsule; Take 1 capsule (50,000 Units total) by mouth every 7 (seven) days. Take for 8 total doses(weeks)  Trouble in  sleeping  Morbid obesity (HCC)  B12 deficiency -     cyanocobalamin (VITAMIN B12) injection 1,000 mcg  Elevated TSH -     TSH + free T4 -     Thyroid peroxidase antibody  Decreased hemoglobin -     Fe+TIBC+Fer  Other orders -     Discontinue: Vitamin D, Ergocalciferol, (DRISDOL) 1.25 MG (50000 UNIT) CAPS capsule; Take 1 capsule (50,000 Units total) by mouth every 7 (seven) days. Take for 8 total doses(weeks)  Unclear etiology of fatigue. Discussed with patient she had many ongoing issues that can cause fatigue.  She is obese and suggested sleep study, pt declined.  B12 low normal, b12 given today. Follow up in 1 month for next shot.  Will recheck ferritin/serum iron for anema Vitamin D is low start 50,000 units weekly with K2 and diary Consider start magnesium 400mg  at bedtime for all other myalgia Increase protein since low when checked last Recheck TSH since elevated at last check Focus on getting 8 hours of sleep a night Try to walk daily at least 15 minutes    Return in about 3 months (around 10/13/2023) for follow up thyroid/vitamin D/b12 follow up in 1 month.    Tandy Gaw, PA-C

## 2023-07-13 NOTE — Patient Instructions (Addendum)
Magnesium 400mg  at bedtime.  Vitamin D once a week.  Tuna at least once a week.

## 2023-07-14 ENCOUNTER — Encounter: Payer: Self-pay | Admitting: Physician Assistant

## 2023-07-14 DIAGNOSIS — R710 Precipitous drop in hematocrit: Secondary | ICD-10-CM | POA: Insufficient documentation

## 2023-07-14 DIAGNOSIS — R5383 Other fatigue: Secondary | ICD-10-CM | POA: Insufficient documentation

## 2023-07-14 DIAGNOSIS — R7989 Other specified abnormal findings of blood chemistry: Secondary | ICD-10-CM | POA: Insufficient documentation

## 2023-07-14 NOTE — Progress Notes (Signed)
No abnormal thyroid antibodies. Ferritin elevated but can be elevated due to inflammation not just iron stores. Serum iron normal.

## 2023-08-13 ENCOUNTER — Ambulatory Visit (INDEPENDENT_AMBULATORY_CARE_PROVIDER_SITE_OTHER): Payer: Medicare Other | Admitting: Physician Assistant

## 2023-08-13 VITALS — BP 122/57 | HR 87 | Ht 64.0 in | Wt 260.0 lb

## 2023-08-13 DIAGNOSIS — E538 Deficiency of other specified B group vitamins: Secondary | ICD-10-CM | POA: Diagnosis not present

## 2023-08-13 MED ORDER — CYANOCOBALAMIN 1000 MCG/ML IJ SOLN
1000.0000 ug | Freq: Once | INTRAMUSCULAR | Status: AC
Start: 2023-08-13 — End: 2023-08-13
  Administered 2023-08-13: 1000 ug via INTRAMUSCULAR

## 2023-08-13 NOTE — Progress Notes (Signed)
Agree with below plan.  

## 2023-08-13 NOTE — Progress Notes (Signed)
Patient is here for a vitamin B12 injection. Denies GI problems or dizziness.   Patient tolerated injection well without complications. Pt is advised to schedule next injection in 30 days.  Location:  RD 

## 2023-09-13 ENCOUNTER — Ambulatory Visit (INDEPENDENT_AMBULATORY_CARE_PROVIDER_SITE_OTHER): Payer: Medicare Other

## 2023-09-13 VITALS — BP 117/66 | HR 85 | Ht 64.0 in

## 2023-09-13 DIAGNOSIS — E538 Deficiency of other specified B group vitamins: Secondary | ICD-10-CM | POA: Diagnosis not present

## 2023-09-13 MED ORDER — CYANOCOBALAMIN 1000 MCG/ML IJ SOLN
1000.0000 ug | Freq: Once | INTRAMUSCULAR | Status: AC
Start: 2023-09-13 — End: 2023-09-13
  Administered 2023-09-13: 1000 ug via INTRAMUSCULAR

## 2023-09-13 NOTE — Progress Notes (Signed)
   Established Patient Office Visit  Subjective   Patient ID: Sharon Tyler, female    DOB: [birth year]  Age: 57 y.o. MRN: [id number]  Chief Complaint  Patient presents with   B12 deficiency    B12 injection nurse visit.     HPI  B12 injection nurse visit. Patient  denies any new irregular heart rate  or GI problems since starting B12 injections. She did mention that the B12 injections are meant to see if symptoms improve of dizziness and occasioanl heart flutters x since mid Spring.   ROS    Objective:     BP 117/66   Pulse 85   Ht 5\' 4"  (1.626 m)   SpO2 97%   BMI 44.63 kg/m    Physical Exam   No results found for any visits on 09/13/23.    The ASCVD Risk score (Arnett DK, et al., 2019) failed to calculate for the following reasons:   Unable to determine if patient is Non-Hispanic African American    Assessment & Plan:  B12 injection . Admin 1,045mcg IM Left deltoid. Patient tolerated injection well without complications. Patient scheduled to see Tandy Gaw , NP on Oct 30th.  Problem List Items Addressed This Visit       Other   B12 deficiency - Primary    Return in about 1 month (around 10/13/2023) for visit with Tandy Gaw, PA.    Elizabeth Palau, LPN

## 2023-09-13 NOTE — Patient Instructions (Signed)
Return at scheduled appt - Oct 13, 2023 for visit with Tandy Gaw, PA.

## 2023-10-13 ENCOUNTER — Ambulatory Visit (INDEPENDENT_AMBULATORY_CARE_PROVIDER_SITE_OTHER): Payer: Medicare Other | Admitting: Physician Assistant

## 2023-10-13 VITALS — BP 138/75 | HR 89 | Ht 64.0 in | Wt 261.0 lb

## 2023-10-13 DIAGNOSIS — E538 Deficiency of other specified B group vitamins: Secondary | ICD-10-CM | POA: Diagnosis not present

## 2023-10-13 DIAGNOSIS — R7989 Other specified abnormal findings of blood chemistry: Secondary | ICD-10-CM | POA: Diagnosis not present

## 2023-10-13 DIAGNOSIS — I10 Essential (primary) hypertension: Secondary | ICD-10-CM

## 2023-10-13 DIAGNOSIS — G8929 Other chronic pain: Secondary | ICD-10-CM

## 2023-10-13 DIAGNOSIS — L918 Other hypertrophic disorders of the skin: Secondary | ICD-10-CM

## 2023-10-13 DIAGNOSIS — E559 Vitamin D deficiency, unspecified: Secondary | ICD-10-CM | POA: Diagnosis not present

## 2023-10-13 DIAGNOSIS — R5383 Other fatigue: Secondary | ICD-10-CM

## 2023-10-13 DIAGNOSIS — M5441 Lumbago with sciatica, right side: Secondary | ICD-10-CM

## 2023-10-13 DIAGNOSIS — S99921A Unspecified injury of right foot, initial encounter: Secondary | ICD-10-CM

## 2023-10-13 MED ORDER — CYANOCOBALAMIN 1000 MCG/ML IJ SOLN
1000.0000 ug | Freq: Once | INTRAMUSCULAR | Status: AC
Start: 2023-10-13 — End: 2023-10-13
  Administered 2023-10-13: 1000 ug via INTRAMUSCULAR

## 2023-10-13 NOTE — Progress Notes (Signed)
Established Patient Office Visit  Subjective   Patient ID: Sharon Tyler, female    DOB: July 10, 1966  Age: 57 y.o. MRN: [id number]  Chief Complaint  Patient presents with   Medical Management of Chronic Issues    Thyroid and vitamin d and b12 fup    HPI  Sharon Tyler is a 57 year old female who presents to the clinic for follow up. She states her fatigue has gotten a little bit better. She started oral vitamin D and B12 shots and feels as though she doesn't get as fatigued as she does activities throughout the day. She is requesting refills of her folic acid today. She hasn't started taking magnesium because she is unsure of which one to take. She is worried about having GI side effects.  She is still in the same amount of pain but is still not wanting to try any medications. She states she has been experiencing intermittent pain from her neck that shoots down into her elbow. She has not tried any medications for it and does not want to try anything.   She is experiencing ocular symptoms such as a "gray curtain" and floaters in her vision. She states her mother had similar symptoms when she had macular degeneration. She plans on scheduling an eye doctor appt.   She is requesting a skin tag on the back of her right neck be removed today. It seems to be growing and getting caught on things around her neck. She is also is wondering about a lesion on her R pinky toe.  .. Active Ambulatory Problems    Diagnosis Date Noted   Psoriatic arthritis (HCC) 01/22/2017   Morbid obesity (HCC) 01/25/2017   Varicose veins of lower extremity 06/30/2019   B12 deficiency 07/03/2019   Vitamin D deficiency 07/03/2019   Chronic right-sided low back pain with right-sided sciatica 11/20/2019   Facet arthritis of lumbar region 11/21/2019   DDD (degenerative disc disease), lumbar 11/21/2019   Muscle spasm 11/21/2019   Right leg weakness 11/21/2019   Other intervertebral disc degeneration, lumbar region  11/21/2019   Panic attack 03/13/2020   Right leg swelling 05/07/2020   Essential hypertension 08/15/2020   Grief reaction 09/02/2020   Right foot pain 05/13/2021   Left elbow pain 05/13/2021   Metatarsalgia of right foot 05/13/2021   Fine motor impairment 09/08/2021   Macrocytosis 07/24/2022   Urinary retention 07/24/2022   Elevated fasting glucose 07/24/2022   Abnormal RBC 01/18/2023   Elevated MCV 01/18/2023   Trouble in sleeping 07/13/2023   Decreased hemoglobin 07/14/2023   Elevated TSH 07/14/2023   No energy 07/14/2023   Resolved Ambulatory Problems    Diagnosis Date Noted   Spinal stenosis of lumbosacral region 11/20/2019   No Additional Past Medical History    ROS See HPI   Objective:     BP 138/75   Pulse 89   Ht 5\' 4"  (1.626 m)   Wt 261 lb (118.4 kg)   SpO2 99%   BMI 44.80 kg/m  BP Readings from Last 3 Encounters:  10/13/23 138/75  09/13/23 117/66  08/13/23 (!) 122/57   Wt Readings from Last 3 Encounters:  10/13/23 261 lb (118.4 kg)  08/13/23 260 lb (117.9 kg)  07/13/23 260 lb 1.3 oz (118 kg)    Skin Tag Removal Procedure Note Diagnosis: inflamed skin tags Location: neck Informed Consent: Discussed risks (permanent scarring, infection, pain, bleeding, bruising, redness, and recurrence of the lesion) and benefits of the procedure, as well as  the alternatives. She is aware that skin tags are benign lesions, and their removal is often not considered medically necessary. Informed consent was obtained. Preparation: The area was prepared in a standard fashion. Anesthesia: not required Procedure Details: Iris scissors were used to perform sharp removal. Aluminum chloride was applied for hemostasis. Ointment and bandage were applied where needed. The patient tolerated the procedure well. Total number of lesions treated: 1 Plan: The patient was instructed on post-op care. Recommend OTC analgesia as needed for pain.   Physical Exam Constitutional:       Appearance: She is obese.  HENT:     Head: Normocephalic and atraumatic.  Cardiovascular:     Rate and Rhythm: Normal rate.  Pulmonary:     Effort: Pulmonary effort is normal.  Feet:     Comments: R pinky toe with small blood lesion on outside of toenail.  Skin:    Comments: Small skin tag on back of R side of neck   Neurological:     General: No focal deficit present.     Mental Status: She is alert and oriented to person, place, and time.  Psychiatric:        Mood and Affect: Mood normal.        Behavior: Behavior normal.       Assessment & Plan:  Marland KitchenMarland KitchenKaedynce was seen today for medical management of chronic issues.  Diagnoses and all orders for this visit:  Essential hypertension -     CMP14+EGFR  B12 deficiency -     B12 and Folate Panel -     CMP14+EGFR -     cyanocobalamin (VITAMIN B12) injection 1,000 mcg  Vitamin D deficiency -     Vitamin D (25 hydroxy) -     CMP14+EGFR  Elevated TSH -     CMP14+EGFR  No energy -     Vitamin D (25 hydroxy) -     CMP14+EGFR  Chronic right-sided low back pain with right-sided sciatica  Inflamed skin tag  Injury of right toe, initial encounter   Removal of skin tag on the back of R neck done today. B12 shot given today. B12 and folate/vit D/CMP done today.  Pt refusing medication for her chronic pain today. Continue to see rheumatology for Psoriatic Arthritis.  Encouraged going to ophthalmology for ocular symptoms. R pinky toe has small lesion. Looks like it may be blood.Should resolve on it's own.  Pt refused muscle relaxer to try to relieve nerve pain shooting down into elbow.  Start magnesium 400mg  once daily for myalgias.  BP improved on 2nd recheck.    Return in about 6 months (around 04/12/2024), or if symptoms worsen or fail to improve.    Tandy Gaw, PA-C

## 2023-10-13 NOTE — Patient Instructions (Signed)
Magnesium glycinate/lactate/citrate 400mg 

## 2023-10-14 LAB — CMP14+EGFR
ALT: 18 [IU]/L (ref 0–32)
AST: 21 [IU]/L (ref 0–40)
Albumin: 4.3 g/dL (ref 3.8–4.9)
Alkaline Phosphatase: 109 [IU]/L (ref 44–121)
BUN/Creatinine Ratio: 11 (ref 9–23)
BUN: 10 mg/dL (ref 6–24)
Bilirubin Total: 1.3 mg/dL — ABNORMAL HIGH (ref 0.0–1.2)
CO2: 24 mmol/L (ref 20–29)
Calcium: 9.2 mg/dL (ref 8.7–10.2)
Chloride: 106 mmol/L (ref 96–106)
Creatinine, Ser: 0.92 mg/dL (ref 0.57–1.00)
Globulin, Total: 2.2 g/dL (ref 1.5–4.5)
Glucose: 93 mg/dL (ref 70–99)
Potassium: 4.6 mmol/L (ref 3.5–5.2)
Sodium: 143 mmol/L (ref 134–144)
Total Protein: 6.5 g/dL (ref 6.0–8.5)
eGFR: 72 mL/min/{1.73_m2} (ref >59)

## 2023-10-14 LAB — B12 AND FOLATE PANEL
Folate: 10.7 ng/mL (ref >3.0)
Vitamin B-12: >2000 pg/mL — ABNORMAL HIGH (ref 232–1245)

## 2023-10-14 LAB — VITAMIN D 25 HYDROXY (VIT D DEFICIENCY, FRACTURES): Vit D, 25-Hydroxy: 52.8 ng/mL (ref 30.0–100.0)

## 2023-10-15 ENCOUNTER — Encounter: Payer: Self-pay | Admitting: Physician Assistant

## 2023-10-15 DIAGNOSIS — L918 Other hypertrophic disorders of the skin: Secondary | ICD-10-CM | POA: Insufficient documentation

## 2023-10-15 DIAGNOSIS — S99921A Unspecified injury of right foot, initial encounter: Secondary | ICD-10-CM | POA: Insufficient documentation

## 2023-10-15 NOTE — Progress Notes (Signed)
Sharon Tyler,   Vitamin D looks GREAT.  Kidney and liver look good.  B12 much better and now a little on high range. Lets see what stopping shots and going to oral b12 daily does to hold you at a good level. Recheck b12 in 3 months.

## 2024-04-11 ENCOUNTER — Encounter: Payer: Self-pay | Admitting: Physician Assistant

## 2024-04-11 ENCOUNTER — Ambulatory Visit (INDEPENDENT_AMBULATORY_CARE_PROVIDER_SITE_OTHER): Payer: Medicare Other | Admitting: Physician Assistant

## 2024-04-11 VITALS — BP 136/71 | HR 59 | Ht 64.0 in | Wt 245.0 lb

## 2024-04-11 DIAGNOSIS — M47816 Spondylosis without myelopathy or radiculopathy, lumbar region: Secondary | ICD-10-CM | POA: Diagnosis not present

## 2024-04-11 DIAGNOSIS — E538 Deficiency of other specified B group vitamins: Secondary | ICD-10-CM | POA: Diagnosis not present

## 2024-04-11 DIAGNOSIS — E559 Vitamin D deficiency, unspecified: Secondary | ICD-10-CM

## 2024-04-11 DIAGNOSIS — N182 Chronic kidney disease, stage 2 (mild): Secondary | ICD-10-CM

## 2024-04-11 DIAGNOSIS — L4050 Arthropathic psoriasis, unspecified: Secondary | ICD-10-CM

## 2024-04-11 DIAGNOSIS — I10 Essential (primary) hypertension: Secondary | ICD-10-CM

## 2024-04-11 MED ORDER — VITAMIN D (ERGOCALCIFEROL) 1.25 MG (50000 UNIT) PO CAPS: 50000.0000 [IU] | ORAL_CAPSULE | ORAL | 3 refills | Status: AC

## 2024-04-11 MED ORDER — TRIAMCINOLONE ACETONIDE 0.1 % EX CREA: 1.0000 | TOPICAL_CREAM | Freq: Two times a day (BID) | CUTANEOUS | 0 refills | Status: AC

## 2024-04-11 NOTE — Progress Notes (Signed)
 Established Patient Office Visit  Subjective   Patient ID: Sharon Tyler, female    DOB: [birth year]  Age: 58 y.o. MRN: [id number]  Chief Complaint  Patient presents with   Medical Management of Chronic Issues    Essential hypertension      HPI Pt is a 58 yo obese female with Psoriatic arthritis, b12 and vitamin D  deficiency, HTN, chronic pain due to arthritis, anxiety, elevated TSH, elevated RBC who presents to the clinic for follow up.   She is on disability due to PsOA. Pain ongoing with not much change.  She had recent labs with rheumatology and would like to go over them. Concerned with decreased in GFR.   She continues to be fatigued. She was given B12 to start but wants to go over how to give herself injections.   .. Active Ambulatory Problems    Diagnosis Date Noted   Psoriatic arthritis (HCC) 01/22/2017   Morbid obesity (HCC) 01/25/2017   Varicose veins of lower extremity 06/30/2019   B12 deficiency 07/03/2019   Vitamin D  deficiency 07/03/2019   Chronic right-sided low back pain with right-sided sciatica 11/20/2019   Facet arthritis of lumbar region 11/21/2019   DDD (degenerative disc disease), lumbar 11/21/2019   Muscle spasm 11/21/2019   Right leg weakness 11/21/2019   Other intervertebral disc degeneration, lumbar region 11/21/2019   Panic attack 03/13/2020   Right leg swelling 05/07/2020   Essential hypertension 08/15/2020   Grief reaction 09/02/2020   Right foot pain 05/13/2021   Left elbow pain 05/13/2021   Metatarsalgia of right foot 05/13/2021   Fine motor impairment 09/08/2021   Macrocytosis 07/24/2022   Urinary retention 07/24/2022   Elevated fasting glucose 07/24/2022   Abnormal RBC 01/18/2023   Elevated MCV 01/18/2023   Trouble in sleeping 07/13/2023   Decreased hemoglobin 07/14/2023   Elevated TSH 07/14/2023   No energy 07/14/2023   Inflamed skin tag 10/15/2023   Injury of right toe, initial encounter 10/15/2023   Chronic kidney disease,  stage 2, mildly decreased GFR 04/11/2024   Resolved Ambulatory Problems    Diagnosis Date Noted   Spinal stenosis of lumbosacral region 11/20/2019   No Additional Past Medical History      ROS See HPI.    Objective:     BP 136/71   Pulse (!) 59   Ht 5\' 4"  (1.626 m)   Wt 245 lb (111.1 kg)   SpO2 99%   BMI 42.05 kg/m  BP Readings from Last 3 Encounters:  04/11/24 136/71  10/13/23 138/75  09/13/23 117/66   Wt Readings from Last 3 Encounters:  04/11/24 245 lb (111.1 kg)  10/13/23 261 lb (118.4 kg)  08/13/23 260 lb (117.9 kg)      Physical Exam Constitutional:      Appearance: Normal appearance. She is obese.  HENT:     Head: Normocephalic.  Cardiovascular:     Rate and Rhythm: Normal rate and regular rhythm.  Pulmonary:     Effort: Pulmonary effort is normal.     Breath sounds: Normal breath sounds.  Musculoskeletal:     Right lower leg: No edema.     Left lower leg: No edema.     Comments: Bilateral hand joint deformities and changes due to PsOA  Neurological:     General: No focal deficit present.     Mental Status: She is alert and oriented to person, place, and time.  Psychiatric:        Mood and Affect: Mood normal.  Assessment & Plan:  Aaron AasAaron AasHibba was seen today for medical management of chronic issues.  Diagnoses and all orders for this visit:  B12 deficiency  Vitamin D  deficiency -     Vitamin D , Ergocalciferol , (DRISDOL ) 1.25 MG (50000 UNIT) CAPS capsule; Take 1 capsule (50,000 Units total) by mouth every 7 (seven) days. Take for 8 total doses(weeks)  Essential hypertension  Facet arthritis of lumbar region  Psoriatic arthritis (HCC) -     triamcinolone  cream (KENALOG ) 0.1 %; Apply 1 Application topically 2 (two) times daily.  Chronic kidney disease, stage 2, mildly decreased GFR   Reviewed labs from rheumatology Decreased GFR, discussed medications for prevention of further decline Encouraged her to start farxiga, HO  given Pt will consider Dicussed other ways to prevent decline with low sodium diet, good hydration, and keeping BP under 130/80.   BP to goal  Triamcinolone  given to use as needed for psoriasis  Encouraged use of b12 injections started by rheumatology and recheck in 3 months High dose vitamin D  to resume follow up labs in 3 months  Declines vaccines, colonoscopy, mammogram.    Sandy Crumb, PA-C

## 2024-04-11 NOTE — Patient Instructions (Addendum)
 Dapagliflozin Tablets What is this medication? DAPAGLIFLOZIN (DAP a gli FLOE zin) treats type 2 diabetes. It works by helping your kidneys remove sugar (glucose) from your blood through the urine, which decreases your blood sugar. It may also be used to lower the risk of worsening disease and death caused by kidney disease and heart failure. It works by helping your kidneys remove salt (sodium) from your blood through the urine. This decreases the amount of work the kidneys and heart have to do. Changes to diet and exercise are often combined with this medication. This medicine may be used for other purposes; ask your health care provider or pharmacist if you have questions. COMMON BRAND NAME(S): Farxiga What should I tell my care team before I take this medication? They need to know if you have any of these conditions: Change in diet, eating less Changes to your insulin dose Dehydration Diet low in salt Frequently drink alcohol Having surgery History of diabetic ketoacidosis (DKA) History of genital infections History of pancreatitis or pancreas problems History of urinary tract infections (UTI) Kidney disease Liver disease Serious infection Trouble passing urine Type 1 diabetes An unusual or allergic reaction to dapagliflozin, other medications, foods, dyes, or preservatives Pregnant or trying to get pregnant Breastfeeding How should I use this medication? Take this medication by mouth with water. Take it as directed on the prescription label at the same time every day. You can take it with or without food. If it upsets your stomach, take it with food. Keep taking it unless your care team tells you to stop. A special MedGuide will be given to you by the pharmacist with each prescription and refill. Be sure to read this information carefully each time. Talk to your care team about the use of this medication in children. While it may be prescribed for children as young as 10 years for  selected conditions, precautions do apply. Overdosage: If you think you have taken too much of this medicine contact a poison control center or emergency room at once. NOTE: This medicine is only for you. Do not share this medicine with others. What if I miss a dose? If you miss a dose, take it as soon as you can. If it is almost time for your next dose, take only that dose. Do not take double or extra doses. What may interact with this medication? Lithium Sulfonylureas, such as glimepiride, glipizide, glyburide This list may not describe all possible interactions. Give your health care provider a list of all the medicines, herbs, non-prescription drugs, or dietary supplements you use. Also tell them if you smoke, drink alcohol, or use illegal drugs. Some items may interact with your medicine. What should I watch for while using this medication? Visit your care team for regular checks on your progress. Tell your care team if your symptoms do not start to get better or if they get worse. You may need blood work done while you are taking this medication. Your care team will monitor your HbA1C (A1C). This test shows what your average blood sugar (glucose) level was over the past 2 to 3 months. Know the symptoms of low blood sugar and know how to treat it. Always carry a source of quick sugar with you. Examples include hard sugar candy or glucose tablets. Make sure others know that you can choke if you eat or drink if your blood sugar is too low and you are unable to care for yourself. Get medical help at once. Tell your  care team if you have high blood sugar. Your medication dose may change if your body is under stress. Some types of stress that may affect your blood sugar include fever, infection, and surgery. This medication may increase the risk of diabetic ketoacidosis (DKA), a serious condition in which high ketone levels make your blood too acidic. Your care team may ask you to check for ketones in  your urine or blood while you are taking this medication. If you develop nausea, vomiting, stomach pain, unusual weakness or fatigue, or trouble breathing, stop taking this medication and call your care team right away. Check with your care team if you have severe diarrhea, nausea, and vomiting, or if you sweat a lot. The loss of too much body fluid may make it dangerous for you to take this medication. Wear a medical ID bracelet or chain. Carry a card that describes your condition. List the medications and doses you take on the card. What side effects may I notice from receiving this medication? Side effects that you should report to your care team as soon as possible: Allergic reactions--skin rash, itching, hives, swelling of the face, lips, tongue, or throat Dehydration--increased thirst, dry mouth, feeling faint or lightheaded, headache, dark yellow or brown urine Diabetic ketoacidosis (DKA)--increased thirst or amount of urine, dry mouth, fatigue, fruity odor to breath, trouble breathing, stomach pain, nausea, vomiting Genital yeast infection--redness, swelling, pain, or itchiness, odor, thick or lumpy discharge New pain or tenderness, change in skin color, sores or ulcers, infection of the leg or foot Infection or redness, swelling, tenderness, or pain in the genitals, or area from the genitals to the back of the rectum Urinary tract infection (UTI)--burning when passing urine, passing frequent small amounts of urine, bloody or cloudy urine, pain in the lower back or sides This list may not describe all possible side effects. Call your doctor for medical advice about side effects. You may report side effects to FDA at 1-800-FDA-1088. Where should I keep my medication? Keep out of the reach of children and pets. Store at room temperature between 20 and 25 degrees C (68 and 77 degrees F). Get rid of any unused medication after the expiration date. To get rid of medications that are no longer  needed or have expired: Take the medication to a medication take-back program. Check with your pharmacy or law enforcement to find a location. If you cannot return the medication, check the label or package insert to see if the medication should be thrown out in the garbage or flushed down the toilet. If you are not sure, ask your care team. If it is safe to put it in the trash, take the medication out of the container. Mix the medication with cat litter, dirt, coffee grounds, or other unwanted substance. Seal the mixture in a bag or container. Put it in the trash. NOTE: This sheet is a summary. It may not cover all possible information. If you have questions about this medicine, talk to your doctor, pharmacist, or health care provider.  2024 Elsevier/Gold Standard (2023-05-31 00:00:00) Chronic Kidney Disease: Eating Plan Chronic kidney disease (CKD) is when your kidneys aren't working well. They can't remove waste, fluids, and other substances from your blood. When these substances build up, they can worsen kidney damage and affect your health. Eating certain foods can lead to a buildup of these substances. Changing your diet can help prevent more kidney damage. Diet changes may also delay dialysis or even keep you from needing it.  What nutrients should I limit? Work with your health care team and an expert in healthy eating (dietitian) to make a meal plan that's right for you. Foods you can eat and foods you should limit or avoid will depend on: The stage of your kidney disease. Any other conditions you have. The items listed below are not a complete list. Talk with a dietitian to learn what's best for you. Potassium Potassium affects how well your heart beats. Too much potassium in your blood can cause an irregular heartbeat or even a heart attack. You may need to limit foods that are high in potassium, such as: Liquid milk and soy milk. Salt substitutes that contain potassium. Fruits  like: Bananas. Apricots. Melon. Prunes and raisins. Kiwi. Nectarines and oranges. Vegetables, such as: Potatoes, sweet potatoes, and yams. Tomatoes. Leafy greens. Beets. Avocado. Pumpkin and winter squash. Beans, like lima beans. Nuts. Phosphorus Phosphorus is a mineral found in your bones. You need a balance between calcium and phosphorus to build and maintain healthy bones.  Too much added phosphorus from the foods you eat can pull calcium from your bones. Losing calcium can make your bones weak and more likely to break. Too much phosphorus can also make your skin itch. You may need to limit foods that are high in phosphorus or that have added phosphorus, such as: Liquid milk and dairy products. Dark-colored sodas or soft drinks. Bran cereals and oatmeal. Protein  Protein helps your body make and keep muscle. Protein also helps to repair your body's cells and tissues.  One of the natural breakdown products of protein is a waste product called urea. When your kidneys aren't working well, they can't remove waste like urea. Reducing protein in your diet can help keep urea from building up in your blood. Depending on your stage of kidney disease, you may need to eat smaller portions of foods that are high in protein. Sources of animal protein include: Meat (all types). Fish and seafood. Poultry. Eggs. Dairy. Other protein foods include: Beans and legumes. Nuts and nut butter. Soy, like tofu.  Sodium Salt (sodium) helps to keep a healthy balance of fluids in your body. Too much salt can increase your blood pressure, which can harm your heart and lungs. Extra salt can also cause your body to keep too much fluid, making your kidneys work harder. You may need to limit or avoid foods that are high in salt, such as: Salt seasonings. Soy and teriyaki sauce. Meats that are: Packaged. Precooked. Cured. Processed. Salted crackers and snack foods. Fast food. Canned soups and  foods. Pickled foods. Boxed mixes or ready-to-eat boxed meals and side dishes. Bottled dressings, sauces, and marinades. Talk with your dietitian about how much potassium, phosphorus, protein, and salt you may have each day. What are tips for following this plan? Reading food labels  Check the amount of salt in foods. Limit foods that have salt listed among the first five ingredients. Try to eat low-salt foods. Check the ingredient list for added phosphorus or potassium. "Phos" in an ingredient is a sign that phosphorus has been added. Do not buy foods that are calcium-enriched or that have calcium added to them (fortified). Buy canned vegetables and beans that say "no salt added." Rinse them before eating. Lifestyle Limit the amount of protein you eat from animal sources each day. Focus on protein from plant sources, like tofu and dried beans, peas, and lentils. Do not add salt to food when cooking or before eating. Do not  eat star fruit. It can be toxic for people with kidney problems. Talk with your health care provider before taking any vitamin or mineral supplements. If told by your provider: Track how much liquid you have so you can avoid drinking too much. Try to eat foods that are made mostly from water, like gelatin, ice cream, soups, and juicy fruits and vegetables. If you have diabetes and chronic kidney disease: If you have diabetes and CKD, you need to keep your blood sugar (glucose) in the target range recommended by your provider. Follow your diabetes management plan. This may include: Checking your blood glucose regularly. Taking medicines by mouth, or taking insulin, or both. Exercising for at least 30 minutes on 5 or more days each week, or as told by your provider. Tracking how many servings of carbohydrates you eat at each meal. Not using orange juice to treat low blood sugars. Instead, use apple juice, cranberry juice, or clear soda. You may be given guidelines on what  foods and nutrients you may eat, and how much you can have each day. This depends on your stage of kidney disease and whether you have high blood pressure. Follow the meal plan your dietitian gives you. Where to find more information [employer] of Diabetes and Digestive and Kidney Diseases: [employer].si National Kidney Foundation: kidney.org This information is not intended to replace advice given to you by your health care provider. Make sure you discuss any questions you have with your health care provider. Document Revised: 07/13/2023 Document Reviewed: 07/13/2023 Elsevier Patient Education  2024 [employer].

## 2024-04-17 ENCOUNTER — Encounter: Payer: Self-pay | Admitting: Physician Assistant

## 2024-05-31 ENCOUNTER — Ambulatory Visit: Payer: Self-pay

## 2024-05-31 NOTE — Telephone Encounter (Signed)
 Have her come in for nurse visit to show her how.

## 2024-05-31 NOTE — Telephone Encounter (Signed)
 Copied from CRM [id number]. Topic: Clinical - Medical Advice >> May 31, 2024 11:06 AM [employer] wrote: Reason for CRM: Patient calling regarding B12 which she gives herself. She is having a hard time with the syringe. Reason for Disposition  Health Information question, no triage required and triager able to answer question  Answer Assessment - Initial Assessment Questions 1. REASON FOR CALL or QUESTION: What is your reason for calling today? or How can I best help you? or What question do you have that I can help answer?     Patient called with concerns with her home vitamin B12 shot. Patient states she was having equipment issues with her syringes. Patient states she was able to give herself the shot but was having issues with medication not remaining in the syringe prior to giving herself the shot. Patient was instructed to speak with pharmacist to show what she is doing. Patient endorses no pain or any type of discomfort. No swelling to the area.  Protocols used: Information Only Call - No Triage-A-AH

## 2024-06-02 NOTE — Telephone Encounter (Signed)
 Heylee states she has taken care of giving the injection.

## 2024-10-11 ENCOUNTER — Ambulatory Visit: Admitting: Physician Assistant

## 2024-10-11 ENCOUNTER — Encounter: Payer: Self-pay | Admitting: Physician Assistant

## 2024-10-11 VITALS — BP 130/70 | HR 71 | Ht 64.0 in | Wt 223.0 lb

## 2024-10-11 DIAGNOSIS — E559 Vitamin D deficiency, unspecified: Secondary | ICD-10-CM

## 2024-10-11 DIAGNOSIS — I10 Essential (primary) hypertension: Secondary | ICD-10-CM

## 2024-10-11 DIAGNOSIS — L4050 Arthropathic psoriasis, unspecified: Secondary | ICD-10-CM

## 2024-10-11 DIAGNOSIS — E538 Deficiency of other specified B group vitamins: Secondary | ICD-10-CM

## 2024-10-11 MED ORDER — FOLIC ACID 1 MG PO TABS: 1.0000 mg | ORAL_TABLET | Freq: Every day | ORAL | 3 refills | Status: DC

## 2024-10-11 NOTE — Patient Instructions (Signed)
 Chronic Kidney Disease: Eating Plan Chronic kidney disease (CKD) is when your kidneys aren't working well. They can't remove waste, fluids, and other substances from your blood. When these substances build up, they can worsen kidney damage and affect your health. Eating certain foods can lead to a buildup of these substances. Changing your diet can help prevent more kidney damage. Diet changes may also delay dialysis or even keep you from needing it. What nutrients should I limit? Work with your health care team and an expert in healthy eating (dietitian) to make a meal plan that's right for you. Foods you can eat and foods you should limit or avoid will depend on: The stage of your kidney disease. Any other conditions you have. The items listed below are not a complete list. Talk with a dietitian to learn what's best for you. Potassium Potassium affects how well your heart beats. Too much potassium in your blood can cause an irregular heartbeat or even a heart attack. You may need to limit foods that are high in potassium, such as: Liquid milk and soy milk. Salt substitutes that contain potassium. Fruits like: Bananas. Apricots. Melon. Prunes and raisins. Kiwi. Nectarines and oranges. Vegetables, such as: Potatoes, sweet potatoes, and yams. Tomatoes. Leafy greens. Beets. Avocado. Pumpkin and winter squash. Beans, like lima beans. Nuts. Phosphorus Phosphorus is a mineral found in your bones. You need a balance between calcium and phosphorus to build and maintain healthy bones.  Too much added phosphorus from the foods you eat can pull calcium from your bones. Losing calcium can make your bones weak and more likely to break. Too much phosphorus can also make your skin itch. You may need to limit foods that are high in phosphorus or that have added phosphorus, such as: Liquid milk and dairy products. Dark-colored sodas or soft drinks. Bran cereals and oatmeal. Protein  Protein  helps your body make and keep muscle. Protein also helps to repair your body's cells and tissues.  One of the natural breakdown products of protein is a waste product called urea. When your kidneys aren't working well, they can't remove waste like urea. Reducing protein in your diet can help keep urea from building up in your blood. Depending on your stage of kidney disease, you may need to eat smaller portions of foods that are high in protein. Sources of animal protein include: Meat (all types). Fish and seafood. Poultry. Eggs. Dairy. Other protein foods include: Beans and legumes. Nuts and nut butter. Soy, like tofu.  Sodium Salt (sodium) helps to keep a healthy balance of fluids in your body. Too much salt can increase your blood pressure, which can harm your heart and lungs. Extra salt can also cause your body to keep too much fluid, making your kidneys work harder. You may need to limit or avoid foods that are high in salt, such as: Salt seasonings. Soy and teriyaki sauce. Meats that are: Packaged. Precooked. Cured. Processed. Salted crackers and snack foods. Fast food. Canned soups and foods. Pickled foods. Boxed mixes or ready-to-eat boxed meals and side dishes. Bottled dressings, sauces, and marinades. Talk with your dietitian about how much potassium, phosphorus, protein, and salt you may have each day. What are tips for following this plan? Reading food labels  Check the amount of salt in foods. Limit foods that have salt listed among the first five ingredients. Try to eat low-salt foods. Check the ingredient list for added phosphorus or potassium. "Phos" in an ingredient is a sign that  phosphorus has been added. Do not buy foods that are calcium-enriched or that have calcium added to them (fortified). Buy canned vegetables and beans that say "no salt added." Rinse them before eating. Lifestyle Limit the amount of protein you eat from animal sources each day. Focus on  protein from plant sources, like tofu and dried beans, peas, and lentils. Do not add salt to food when cooking or before eating. Do not eat star fruit. It can be toxic for people with kidney problems. Talk with your health care provider before taking any vitamin or mineral supplements. If told by your provider: Track how much liquid you have so you can avoid drinking too much. Try to eat foods that are made mostly from water, like gelatin, ice cream, soups, and juicy fruits and vegetables. If you have diabetes and chronic kidney disease: If you have diabetes and CKD, you need to keep your blood sugar (glucose) in the target range recommended by your provider. Follow your diabetes management plan. This may include: Checking your blood glucose regularly. Taking medicines by mouth, or taking insulin, or both. Exercising for at least 30 minutes on 5 or more days each week, or as told by your provider. Tracking how many servings of carbohydrates you eat at each meal. Not using orange juice to treat low blood sugars. Instead, use apple juice, cranberry juice, or clear soda. You may be given guidelines on what foods and nutrients you may eat, and how much you can have each day. This depends on your stage of kidney disease and whether you have high blood pressure. Follow the meal plan your dietitian gives you. Where to find more information [employer]: [employer] National Kid[employer] Foundation: kidney.org This information is not intended to replace advice given to you by your health care provider. Make sure you discuss any questions you have with your health care provider. Document Revised: 07/13/2023 Document Reviewed: 07/13/2023 Elsevier Patient Education  2024 [employer].

## 2024-10-11 NOTE — Progress Notes (Signed)
   Established Patient Office Visit  Subjective   Patient ID: Sharon Tyler, female    DOB: 1966-11-17  Age: 58 y.o. MRN: [id number]  Chief Complaint  Patient presents with   Medical Management of Chronic Issues    HPI Pt is a 58 yo obese female with Psoratic Arthritis, HTN, Vitamin D /B12 def who presents to the clinic for 6 month follow up.   Pt is in chronic pain but declines any pain medication. She is seen by rheumatology every 6 months. She denies any CP, palpitations, headaches, or vision changes. She is compliant with medication. She is not very active.    ROS See HPI.    Objective:     BP 130/70   Pulse 71   Ht 5' 4 (1.626 m)   Wt 223 lb (101.2 kg)   SpO2 99%   BMI 38.28 kg/m  BP Readings from Last 3 Encounters:  10/11/24 130/70  04/11/24 136/71  10/13/23 138/75   Wt Readings from Last 3 Encounters:  10/11/24 223 lb (101.2 kg)  04/11/24 245 lb (111.1 kg)  10/13/23 261 lb (118.4 kg)      Physical Exam Constitutional:      Appearance: Normal appearance. She is obese.  HENT:     Head: Normocephalic.  Cardiovascular:     Rate and Rhythm: Normal rate and regular rhythm.  Pulmonary:     Effort: Pulmonary effort is normal.     Breath sounds: Normal breath sounds.  Musculoskeletal:     Comments: Deformed fingers and joints due to RA.   Neurological:     General: No focal deficit present.     Mental Status: She is alert and oriented to person, place, and time.  Psychiatric:        Mood and Affect: Mood normal.        Assessment & Plan:  Sharon Tyler was seen today for medical management of chronic issues.  Diagnoses and all orders for this visit:  Essential hypertension  B12 deficiency  Vitamin D  deficiency  Psoriatic arthritis (HCC) -     Discontinue: folic acid  (FOLVITE ) 1 MG tablet; Take 1 tablet (1 mg total) by mouth daily.   BP to goal.  Labs ordered through rheumatology Continue high dose vitamin d  weekly Pt declined all vaccines  including flu shot Pt declined mammogram and colonoscopy and aware of risk,.    Return in about 6 months (around 04/11/2025).    [name], PA-C

## 2024-10-16 ENCOUNTER — Encounter: Payer: Self-pay | Admitting: Physician Assistant

## 2025-04-11 ENCOUNTER — Ambulatory Visit: Admitting: Physician Assistant
